# Patient Record
Sex: Female | Born: 1935
Health system: Southern US, Community
[De-identification: ages and names within clinical notes are randomized; demographics above are authoritative.]

## PROBLEM LIST (undated history)

## (undated) DIAGNOSIS — E785 Hyperlipidemia, unspecified: Secondary | ICD-10-CM

## (undated) DIAGNOSIS — K219 Gastro-esophageal reflux disease without esophagitis: Secondary | ICD-10-CM

## (undated) DIAGNOSIS — K56609 Unspecified intestinal obstruction, unspecified as to partial versus complete obstruction: Secondary | ICD-10-CM

## (undated) DIAGNOSIS — I639 Cerebral infarction, unspecified: Secondary | ICD-10-CM

## (undated) DIAGNOSIS — G459 Transient cerebral ischemic attack, unspecified: Secondary | ICD-10-CM

## (undated) DIAGNOSIS — M1990 Unspecified osteoarthritis, unspecified site: Secondary | ICD-10-CM

## (undated) DIAGNOSIS — Z860100 Personal history of colon polyps, unspecified: Secondary | ICD-10-CM

## (undated) DIAGNOSIS — I10 Essential (primary) hypertension: Secondary | ICD-10-CM

## (undated) DIAGNOSIS — I341 Nonrheumatic mitral (valve) prolapse: Secondary | ICD-10-CM

## (undated) DIAGNOSIS — K5790 Diverticulosis of intestine, part unspecified, without perforation or abscess without bleeding: Secondary | ICD-10-CM

## (undated) HISTORY — PX: APPENDECTOMY: SHX54

## (undated) HISTORY — DX: Hyperlipidemia, unspecified: E78.5

## (undated) HISTORY — DX: Cerebral infarction, unspecified: I63.9

## (undated) HISTORY — DX: Personal history of colon polyps, unspecified: Z86.0100

## (undated) HISTORY — DX: Unspecified intestinal obstruction, unspecified as to partial versus complete obstruction: K56.609

## (undated) HISTORY — DX: Personal history of colonic polyps: Z86.010

## (undated) HISTORY — PX: ROTATOR CUFF REPAIR: SHX139

## (undated) HISTORY — DX: Essential (primary) hypertension: I10

## (undated) HISTORY — DX: Transient cerebral ischemic attack, unspecified: G45.9

## (undated) HISTORY — DX: Unspecified osteoarthritis, unspecified site: M19.90

## (undated) HISTORY — DX: Gastro-esophageal reflux disease without esophagitis: K21.9

## (undated) HISTORY — DX: Diverticulosis of intestine, part unspecified, without perforation or abscess without bleeding: K57.90

## (undated) HISTORY — DX: Nonrheumatic mitral (valve) prolapse: I34.1

---

## 1997-04-13 HISTORY — PX: REPLACEMENT TOTAL KNEE: SUR1224

## 1998-04-13 HISTORY — PX: CATARACT EXTRACTION: SUR2

## 1998-09-04 ENCOUNTER — Encounter: Payer: Self-pay | Admitting: Orthopedic Surgery

## 1998-09-11 ENCOUNTER — Inpatient Hospital Stay (HOSPITAL_COMMUNITY): Admission: RE | Admit: 1998-09-11 | Discharge: 1998-09-16 | Payer: Self-pay | Admitting: Orthopedic Surgery

## 1998-09-11 ENCOUNTER — Encounter: Payer: Self-pay | Admitting: Orthopedic Surgery

## 1998-09-14 ENCOUNTER — Encounter: Payer: Self-pay | Admitting: Orthopedic Surgery

## 1998-09-16 ENCOUNTER — Encounter: Payer: Self-pay | Admitting: Orthopedic Surgery

## 2001-04-13 LAB — HM COLONOSCOPY

## 2001-08-30 ENCOUNTER — Encounter: Payer: Self-pay | Admitting: Internal Medicine

## 2002-07-20 ENCOUNTER — Encounter: Payer: Self-pay | Admitting: Internal Medicine

## 2003-06-30 ENCOUNTER — Emergency Department (HOSPITAL_COMMUNITY): Admission: EM | Admit: 2003-06-30 | Discharge: 2003-07-01 | Payer: Self-pay | Admitting: Emergency Medicine

## 2003-08-14 ENCOUNTER — Encounter: Payer: Self-pay | Admitting: Internal Medicine

## 2003-10-29 ENCOUNTER — Encounter: Payer: Self-pay | Admitting: Family Medicine

## 2003-11-19 ENCOUNTER — Encounter: Payer: Self-pay | Admitting: Internal Medicine

## 2004-05-12 ENCOUNTER — Ambulatory Visit: Payer: Self-pay | Admitting: Internal Medicine

## 2004-06-13 ENCOUNTER — Ambulatory Visit: Payer: Self-pay | Admitting: Internal Medicine

## 2004-08-06 ENCOUNTER — Ambulatory Visit: Payer: Self-pay | Admitting: Internal Medicine

## 2004-09-09 ENCOUNTER — Ambulatory Visit: Payer: Self-pay | Admitting: Internal Medicine

## 2004-11-11 DIAGNOSIS — I639 Cerebral infarction, unspecified: Secondary | ICD-10-CM

## 2004-11-11 HISTORY — DX: Cerebral infarction, unspecified: I63.9

## 2004-11-29 ENCOUNTER — Other Ambulatory Visit: Payer: Self-pay

## 2004-11-29 ENCOUNTER — Inpatient Hospital Stay: Payer: Self-pay | Admitting: Internal Medicine

## 2004-12-03 ENCOUNTER — Ambulatory Visit: Payer: Self-pay | Admitting: Internal Medicine

## 2004-12-08 ENCOUNTER — Ambulatory Visit: Payer: Self-pay | Admitting: *Deleted

## 2004-12-17 ENCOUNTER — Ambulatory Visit: Payer: Self-pay | Admitting: *Deleted

## 2005-01-05 ENCOUNTER — Ambulatory Visit: Payer: Self-pay | Admitting: Internal Medicine

## 2005-05-06 ENCOUNTER — Ambulatory Visit: Payer: Self-pay | Admitting: Internal Medicine

## 2005-07-29 ENCOUNTER — Ambulatory Visit: Payer: Self-pay | Admitting: Internal Medicine

## 2005-11-27 ENCOUNTER — Ambulatory Visit: Payer: Self-pay | Admitting: Internal Medicine

## 2005-11-30 ENCOUNTER — Ambulatory Visit: Payer: Self-pay | Admitting: Internal Medicine

## 2006-03-29 ENCOUNTER — Ambulatory Visit: Payer: Self-pay | Admitting: Internal Medicine

## 2006-07-21 ENCOUNTER — Ambulatory Visit: Payer: Self-pay | Admitting: Internal Medicine

## 2006-07-28 ENCOUNTER — Ambulatory Visit: Payer: Self-pay | Admitting: Internal Medicine

## 2006-11-10 DIAGNOSIS — I6350 Cerebral infarction due to unspecified occlusion or stenosis of unspecified cerebral artery: Secondary | ICD-10-CM | POA: Insufficient documentation

## 2006-11-10 DIAGNOSIS — E785 Hyperlipidemia, unspecified: Secondary | ICD-10-CM | POA: Insufficient documentation

## 2006-11-10 DIAGNOSIS — I10 Essential (primary) hypertension: Secondary | ICD-10-CM | POA: Insufficient documentation

## 2006-11-10 DIAGNOSIS — K648 Other hemorrhoids: Secondary | ICD-10-CM | POA: Insufficient documentation

## 2006-11-10 DIAGNOSIS — Z860100 Personal history of colon polyps, unspecified: Secondary | ICD-10-CM | POA: Insufficient documentation

## 2006-11-10 DIAGNOSIS — K219 Gastro-esophageal reflux disease without esophagitis: Secondary | ICD-10-CM

## 2006-11-23 ENCOUNTER — Ambulatory Visit: Payer: Self-pay | Admitting: Internal Medicine

## 2006-11-24 LAB — CONVERTED CEMR LAB
ALT: 18 units/L (ref 0–35)
Albumin: 3.9 g/dL (ref 3.5–5.2)
BUN: 18 mg/dL (ref 6–23)
CO2: 30 meq/L (ref 19–32)
Calcium: 9.2 mg/dL (ref 8.4–10.5)
Chloride: 107 meq/L (ref 96–112)
Cholesterol: 178 mg/dL (ref 0–200)
Creatinine, Ser: 0.9 mg/dL (ref 0.4–1.2)
GFR calc Af Amer: 77 mL/min
GFR calc non Af Amer: 63 mL/min
Glucose, Bld: 97 mg/dL (ref 70–99)
HDL: 57.9 mg/dL (ref >39.0)
LDL Cholesterol: 106 mg/dL — ABNORMAL HIGH (ref 0–99)
Phosphorus: 4 mg/dL (ref 2.3–4.6)
Potassium: 4.4 meq/L (ref 3.5–5.1)
Sodium: 142 meq/L (ref 135–145)
Total CHOL/HDL Ratio: 3.1
Triglycerides: 72 mg/dL (ref 0–149)
VLDL: 14 mg/dL (ref 0–40)

## 2007-03-28 ENCOUNTER — Ambulatory Visit: Payer: Self-pay | Admitting: Internal Medicine

## 2007-03-29 ENCOUNTER — Telehealth (INDEPENDENT_AMBULATORY_CARE_PROVIDER_SITE_OTHER): Payer: Self-pay | Admitting: *Deleted

## 2007-06-12 DIAGNOSIS — K56609 Unspecified intestinal obstruction, unspecified as to partial versus complete obstruction: Secondary | ICD-10-CM

## 2007-06-12 HISTORY — DX: Unspecified intestinal obstruction, unspecified as to partial versus complete obstruction: K56.609

## 2007-06-21 ENCOUNTER — Other Ambulatory Visit: Payer: Self-pay

## 2007-06-21 ENCOUNTER — Inpatient Hospital Stay: Payer: Medicare Other | Admitting: Surgery

## 2007-07-01 ENCOUNTER — Encounter: Payer: Self-pay | Admitting: Internal Medicine

## 2007-07-11 ENCOUNTER — Encounter: Payer: Self-pay | Admitting: Internal Medicine

## 2007-08-09 ENCOUNTER — Ambulatory Visit: Payer: Self-pay | Admitting: Internal Medicine

## 2007-08-10 LAB — CONVERTED CEMR LAB
ALT: 14 units/L (ref 0–35)
AST: 25 units/L (ref 0–37)
Albumin: 4.1 g/dL (ref 3.5–5.2)
Alkaline Phosphatase: 68 units/L (ref 39–117)
BUN: 21 mg/dL (ref 6–23)
Basophils Absolute: 0 10*3/uL (ref 0.0–0.1)
Basophils Relative: 0.3 % (ref 0.0–1.0)
Bilirubin, Direct: 0.1 mg/dL (ref 0.0–0.3)
CO2: 26 meq/L (ref 19–32)
Calcium: 9.2 mg/dL (ref 8.4–10.5)
Chloride: 110 meq/L (ref 96–112)
Cholesterol: 158 mg/dL (ref 0–200)
Creatinine, Ser: 0.9 mg/dL (ref 0.4–1.2)
Eosinophils Absolute: 0.3 10*3/uL (ref 0.0–0.7)
Eosinophils Relative: 4.6 % (ref 0.0–5.0)
GFR calc Af Amer: 76 mL/min
GFR calc non Af Amer: 63 mL/min
Glucose, Bld: 107 mg/dL — ABNORMAL HIGH (ref 70–99)
HCT: 40.8 % (ref 36.0–46.0)
HDL: 45.7 mg/dL (ref >39.0)
Hemoglobin: 13.8 g/dL (ref 12.0–15.0)
LDL Cholesterol: 98 mg/dL (ref 0–99)
Lymphocytes Relative: 24.9 % (ref 12.0–46.0)
MCHC: 33.8 g/dL (ref 30.0–36.0)
MCV: 87.2 fL (ref 78.0–100.0)
Monocytes Absolute: 0.5 10*3/uL (ref 0.1–1.0)
Monocytes Relative: 8.8 % (ref 3.0–12.0)
Neutro Abs: 3.7 10*3/uL (ref 1.4–7.7)
Neutrophils Relative %: 61.4 % (ref 43.0–77.0)
Phosphorus: 3.8 mg/dL (ref 2.3–4.6)
Platelets: 221 10*3/uL (ref 150–400)
Potassium: 3.8 meq/L (ref 3.5–5.1)
RBC: 4.68 M/uL (ref 3.87–5.11)
RDW: 13.6 % (ref 11.5–14.6)
Sodium: 142 meq/L (ref 135–145)
TSH: 0.91 microintl units/mL (ref 0.35–5.50)
Total Bilirubin: 0.7 mg/dL (ref 0.3–1.2)
Total CHOL/HDL Ratio: 3.5
Total Protein: 7.1 g/dL (ref 6.0–8.3)
Triglycerides: 71 mg/dL (ref 0–149)
VLDL: 14 mg/dL (ref 0–40)
WBC: 6 10*3/uL (ref 4.5–10.5)

## 2007-12-09 ENCOUNTER — Ambulatory Visit: Payer: Self-pay | Admitting: Internal Medicine

## 2008-01-17 ENCOUNTER — Ambulatory Visit: Payer: Self-pay | Admitting: Family Medicine

## 2008-01-17 DIAGNOSIS — L03119 Cellulitis of unspecified part of limb: Secondary | ICD-10-CM

## 2008-01-17 DIAGNOSIS — L02419 Cutaneous abscess of limb, unspecified: Secondary | ICD-10-CM

## 2008-01-19 ENCOUNTER — Ambulatory Visit: Payer: Self-pay | Admitting: Internal Medicine

## 2008-03-30 ENCOUNTER — Ambulatory Visit: Payer: Self-pay | Admitting: Internal Medicine

## 2008-03-30 DIAGNOSIS — M179 Osteoarthritis of knee, unspecified: Secondary | ICD-10-CM | POA: Insufficient documentation

## 2008-03-30 DIAGNOSIS — M1710 Unilateral primary osteoarthritis, unspecified knee: Secondary | ICD-10-CM

## 2008-04-02 LAB — CONVERTED CEMR LAB
ALT: 15 units/L (ref 0–35)
AST: 25 units/L (ref 0–37)
Albumin: 4.7 g/dL (ref 3.5–5.2)
Alkaline Phosphatase: 88 units/L (ref 39–117)
BUN: 16 mg/dL (ref 6–23)
Basophils Absolute: 0 10*3/uL (ref 0.0–0.1)
Basophils Relative: 0 % (ref 0–1)
CO2: 20 meq/L (ref 19–32)
Calcium: 9.6 mg/dL (ref 8.4–10.5)
Chloride: 105 meq/L (ref 96–112)
Cholesterol: 168 mg/dL (ref 0–200)
Creatinine, Ser: 0.89 mg/dL (ref 0.40–1.20)
Eosinophils Absolute: 0.2 10*3/uL (ref 0.0–0.7)
Eosinophils Relative: 3 % (ref 0–5)
Glucose, Bld: 98 mg/dL (ref 70–99)
HCT: 45 % (ref 36.0–46.0)
HDL: 60 mg/dL (ref >39)
Hemoglobin: 14.6 g/dL (ref 12.0–15.0)
LDL Cholesterol: 86 mg/dL (ref 0–99)
Lymphocytes Relative: 26 % (ref 12–46)
Lymphs Abs: 1.8 10*3/uL (ref 0.7–4.0)
MCHC: 32.4 g/dL (ref 30.0–36.0)
MCV: 90.4 fL (ref 78.0–100.0)
Monocytes Absolute: 0.6 10*3/uL (ref 0.1–1.0)
Monocytes Relative: 9 % (ref 3–12)
Neutro Abs: 4.3 10*3/uL (ref 1.7–7.7)
Neutrophils Relative %: 62 % (ref 43–77)
Platelets: 253 10*3/uL (ref 150–400)
Potassium: 4.1 meq/L (ref 3.5–5.3)
RBC: 4.98 M/uL (ref 3.87–5.11)
RDW: 13.6 % (ref 11.5–15.5)
Sodium: 141 meq/L (ref 135–145)
TSH: 2.854 microintl units/mL (ref 0.350–4.50)
Total Bilirubin: 0.5 mg/dL (ref 0.3–1.2)
Total CHOL/HDL Ratio: 2.8
Total Protein: 7.6 g/dL (ref 6.0–8.3)
Triglycerides: 111 mg/dL (ref <150)
VLDL: 22 mg/dL (ref 0–40)
WBC: 6.9 10*3/uL (ref 4.0–10.5)

## 2008-04-04 ENCOUNTER — Telehealth: Payer: Self-pay | Admitting: Internal Medicine

## 2008-06-22 ENCOUNTER — Telehealth (INDEPENDENT_AMBULATORY_CARE_PROVIDER_SITE_OTHER): Payer: Self-pay | Admitting: *Deleted

## 2008-06-26 ENCOUNTER — Ambulatory Visit: Payer: Self-pay | Admitting: Family Medicine

## 2008-06-26 DIAGNOSIS — K5900 Constipation, unspecified: Secondary | ICD-10-CM | POA: Insufficient documentation

## 2008-07-27 ENCOUNTER — Ambulatory Visit: Payer: Self-pay | Admitting: Internal Medicine

## 2008-11-23 ENCOUNTER — Ambulatory Visit: Payer: Self-pay | Admitting: Internal Medicine

## 2008-11-23 LAB — CONVERTED CEMR LAB
ALT: 16 units/L (ref 0–35)
AST: 29 units/L (ref 0–37)
Albumin: 4.1 g/dL (ref 3.5–5.2)
Alkaline Phosphatase: 76 units/L (ref 39–117)
BUN: 26 mg/dL — ABNORMAL HIGH (ref 6–23)
Basophils Absolute: 0 10*3/uL (ref 0.0–0.1)
Basophils Relative: 0.1 % (ref 0.0–3.0)
Bilirubin, Direct: 0 mg/dL (ref 0.0–0.3)
CO2: 25 meq/L (ref 19–32)
Calcium: 9.2 mg/dL (ref 8.4–10.5)
Chloride: 109 meq/L (ref 96–112)
Cholesterol: 143 mg/dL (ref 0–200)
Creatinine, Ser: 0.9 mg/dL (ref 0.4–1.2)
Eosinophils Absolute: 0.2 10*3/uL (ref 0.0–0.7)
Eosinophils Relative: 2.8 % (ref 0.0–5.0)
Glucose, Bld: 102 mg/dL — ABNORMAL HIGH (ref 70–99)
HCT: 43.2 % (ref 36.0–46.0)
HDL: 51.9 mg/dL (ref >39.00)
Hemoglobin: 14.5 g/dL (ref 12.0–15.0)
LDL Cholesterol: 82 mg/dL (ref 0–99)
Lymphocytes Relative: 24.2 % (ref 12.0–46.0)
Lymphs Abs: 1.5 10*3/uL (ref 0.7–4.0)
MCHC: 33.5 g/dL (ref 30.0–36.0)
MCV: 90 fL (ref 78.0–100.0)
Monocytes Absolute: 0.6 10*3/uL (ref 0.1–1.0)
Monocytes Relative: 9.1 % (ref 3.0–12.0)
Neutro Abs: 3.9 10*3/uL (ref 1.4–7.7)
Neutrophils Relative %: 63.8 % (ref 43.0–77.0)
Phosphorus: 3.8 mg/dL (ref 2.3–4.6)
Platelets: 184 10*3/uL (ref 150.0–400.0)
Potassium: 4.1 meq/L (ref 3.5–5.1)
RBC: 4.8 M/uL (ref 3.87–5.11)
RDW: 13.1 % (ref 11.5–14.6)
Sodium: 141 meq/L (ref 135–145)
TSH: 1.21 microintl units/mL (ref 0.35–5.50)
Total Bilirubin: 0.8 mg/dL (ref 0.3–1.2)
Total CHOL/HDL Ratio: 3
Total Protein: 7.2 g/dL (ref 6.0–8.3)
Triglycerides: 45 mg/dL (ref 0.0–149.0)
VLDL: 9 mg/dL (ref 0.0–40.0)
WBC: 6.2 10*3/uL (ref 4.5–10.5)

## 2009-01-03 ENCOUNTER — Encounter: Payer: Self-pay | Admitting: Internal Medicine

## 2009-01-03 ENCOUNTER — Telehealth: Payer: Self-pay | Admitting: Internal Medicine

## 2009-01-11 ENCOUNTER — Ambulatory Visit: Payer: Self-pay | Admitting: Internal Medicine

## 2009-01-15 ENCOUNTER — Encounter: Payer: Self-pay | Admitting: Internal Medicine

## 2009-01-17 ENCOUNTER — Encounter: Payer: Self-pay | Admitting: Internal Medicine

## 2009-05-22 ENCOUNTER — Ambulatory Visit: Payer: Self-pay | Admitting: Family Medicine

## 2009-06-13 ENCOUNTER — Ambulatory Visit: Payer: Self-pay | Admitting: Internal Medicine

## 2009-08-14 ENCOUNTER — Ambulatory Visit: Payer: Self-pay | Admitting: Internal Medicine

## 2009-08-14 DIAGNOSIS — J069 Acute upper respiratory infection, unspecified: Secondary | ICD-10-CM | POA: Insufficient documentation

## 2009-12-17 ENCOUNTER — Ambulatory Visit: Payer: Self-pay | Admitting: Internal Medicine

## 2009-12-17 DIAGNOSIS — I69354 Hemiplegia and hemiparesis following cerebral infarction affecting left non-dominant side: Secondary | ICD-10-CM | POA: Insufficient documentation

## 2009-12-19 LAB — CONVERTED CEMR LAB
ALT: 14 units/L (ref 0–35)
AST: 26 units/L (ref 0–37)
Albumin: 4.4 g/dL (ref 3.5–5.2)
Alkaline Phosphatase: 76 units/L (ref 39–117)
BUN: 21 mg/dL (ref 6–23)
Basophils Absolute: 0 10*3/uL (ref 0.0–0.1)
Basophils Relative: 0.5 % (ref 0.0–3.0)
Bilirubin, Direct: 0.1 mg/dL (ref 0.0–0.3)
CO2: 27 meq/L (ref 19–32)
Calcium: 9 mg/dL (ref 8.4–10.5)
Chloride: 107 meq/L (ref 96–112)
Cholesterol: 155 mg/dL (ref 0–200)
Creatinine, Ser: 0.9 mg/dL (ref 0.4–1.2)
Eosinophils Absolute: 0.3 10*3/uL (ref 0.0–0.7)
Eosinophils Relative: 3.5 % (ref 0.0–5.0)
GFR calc non Af Amer: 63.56 mL/min (ref >60)
Glucose, Bld: 84 mg/dL (ref 70–99)
HCT: 43 % (ref 36.0–46.0)
HDL: 54.9 mg/dL (ref >39.00)
Hemoglobin: 14.7 g/dL (ref 12.0–15.0)
LDL Cholesterol: 77 mg/dL (ref 0–99)
Lymphocytes Relative: 21.7 % (ref 12.0–46.0)
Lymphs Abs: 1.8 10*3/uL (ref 0.7–4.0)
MCHC: 34.2 g/dL (ref 30.0–36.0)
MCV: 90.6 fL (ref 78.0–100.0)
Monocytes Absolute: 0.9 10*3/uL (ref 0.1–1.0)
Monocytes Relative: 10.1 % (ref 3.0–12.0)
Neutro Abs: 5.4 10*3/uL (ref 1.4–7.7)
Neutrophils Relative %: 64.2 % (ref 43.0–77.0)
Phosphorus: 3.6 mg/dL (ref 2.3–4.6)
Platelets: 217 10*3/uL (ref 150.0–400.0)
Potassium: 4.3 meq/L (ref 3.5–5.1)
RBC: 4.74 M/uL (ref 3.87–5.11)
RDW: 14.6 % (ref 11.5–14.6)
Sodium: 143 meq/L (ref 135–145)
TSH: 1.36 microintl units/mL (ref 0.35–5.50)
Total Bilirubin: 0.7 mg/dL (ref 0.3–1.2)
Total CHOL/HDL Ratio: 3
Total Protein: 7.1 g/dL (ref 6.0–8.3)
Triglycerides: 116 mg/dL (ref 0.0–149.0)
VLDL: 23.2 mg/dL (ref 0.0–40.0)
WBC: 8.5 10*3/uL (ref 4.5–10.5)

## 2010-05-13 NOTE — Assessment & Plan Note (Signed)
Summary: 6 MO. F/U/BIR   Vital Signs:  Patient profile:   77 year old female Weight:      148 pounds Temp:     98.3 degrees F oral Pulse rate:   60 / minute Pulse rhythm:   regular BP sitting:   126 / 80  (left arm) Cuff size:   regular  Vitals Entered By: Mervin Hack CMA Duncan Dull) (June 13, 2009 4:14 PM) CC: 6 month follow-up   History of Present Illness: Saw Dr August Saucer Did have fracture has follow up tomorrow--hopefully will be coming off tough since she is right handed Can't drive either--son has had to drive her and sign for her  Still happy at [facility] Everyone has helped her there--she feels supported  No new neurological symptoms Balance is okay no falls  No chest pain No breathing problems No change in exercise tolerance No edema  No trouble with meds No stomach troubles  No myalgias  Allergies: 1)  * Bextra 2)  * Pencillin 3)  * Emycin 4)  * Keflex 5)  * Sulfa 6)  * Vicodin  Past History:  Past medical, surgical, family and social histories (including risk factors) reviewed for relevance to current acute and chronic problems.  Past Medical History: Reviewed history from 03/30/2008 and no changes required. Colonic polyps, hx of Diverticulosis, colon GERD Hyperlipidemia Hypertension CVA  8/06 TIA  hospitalized 12/02 Mitral valve prolapse Osteoarthritis  Past Surgical History: Reviewed history from 07/05/2007 and no changes required. Appendectomy 1940 Right knee replacement  ~1999 Right rotator cuff repair Bilateral Cataracts with IOL  ~2000 Echo- negative 11/04 CVA 08/06 MRI brain 12/02 Colonoscopy/EGD 2003 3/09 Laparatomy and enterolysis for bowel obstruction (Dr Renda Rolls)  Family History: Reviewed history from 03/28/2007 and no changes required. Father: Died at age 72, cerebral hemm Mother: Died at age 63, Alzheimer's Siblings: 2 brothers, 2 sisters CAD in  brother CABG x 5 No HTN DM  brother, sister and Mom No breast or  colon cancer Depression in  brother  Social History: Reviewed history from 11/10/2006 and no changes required. Widowed Children: 2 out of home Retired--housewife Former Smoker Alcohol use-no  Review of Systems       appetite is good weight up 3# over 6 months---now winter though sleeps okay still very happy at [employer]  Physical Exam  General:  alert and normal appearance.   Neck:  supple, no masses, no thyromegaly, no carotid bruits, and no cervical lymphadenopathy.   Lungs:  normal respiratory effort and normal breath sounds.   Heart:  normal rate, regular rhythm, no murmur, and no gallop.   Msk:  cast on right wrist no active synovitis Extremities:  no edema Psych:  normally interactive, good eye contact, not anxious appearing, and not depressed appearing.     Impression & Recommendations:  Problem # 1:  CVA (ICD-434.91) Assessment Unchanged stable status BP and chol Rx plus aspirin only slight balance issues now  Her updated medication list for this problem includes:    Aspirin 81 Mg Tbec (Aspirin) .Marland Kitchen... Take one by mouth every other day  Problem # 2:  HYPERTENSION (ICD-401.9) Assessment: Unchanged good control will check labs next time  Her updated medication list for this problem includes:    Norvasc 10 Mg Tabs (Amlodipine besylate) .Marland Kitchen... Take one by mouth once a day  BP today: 126/80 Prior BP: 144/84 (05/22/2009)  Labs Reviewed: K+: 4.1 (11/23/2008) Creat: : 0.9 (11/23/2008)   Chol: 143 (11/23/2008)   HDL:  51.90 (11/23/2008)   LDL: 82 (11/23/2008)   TG: 45.0 (11/23/2008)  Problem # 3:  HYPERLIPIDEMIA (ICD-272.4) Assessment: Unchanged no problems with meds at goal labs next time  Her updated medication list for this problem includes:    Pravastatin Sodium 40 Mg Tabs (Pravastatin sodium) .Marland Kitchen... 1 tablet by mouth at bedtime  Labs Reviewed: SGOT: 29 (11/23/2008)   SGPT: 16 (11/23/2008)   HDL:51.90 (11/23/2008), 60 (03/30/2008)  LDL:82  (11/23/2008), 86 (57/84/6962)  Chol:143 (11/23/2008), 168 (03/30/2008)  Trig:45.0 (11/23/2008), 111 (03/30/2008)  Problem # 4:  OSTEOARTHRITIS (ICD-715.90) Assessment: Unchanged mild issues only  Complete Medication List: 1)  Pravastatin Sodium 40 Mg Tabs (Pravastatin sodium) .Marland Kitchen.. 1 tablet by mouth at bedtime 2)  Norvasc 10 Mg Tabs (Amlodipine besylate) .... Take one by mouth once a day 3)  Aspirin 81 Mg Tbec (Aspirin) .... Take one by mouth every other day  Patient Instructions: 1)  Please schedule a follow-up appointment in 6 months .   Current Allergies (reviewed today): * BEXTRA * PENCILLIN * EMYCIN * KEFLEX * SULFA * VICODIN

## 2010-05-13 NOTE — Assessment & Plan Note (Signed)
Summary: CONGESTION/ 2:15   Vital Signs:  Patient profile:   77 year old female Height:      61 inches Weight:      145 pounds BMI:     27.50 Temp:     98.3 degrees F oral Pulse rate:   92 / minute Pulse rhythm:   regular BP sitting:   132 / 84  (left arm) Cuff size:   regular  Vitals Entered By: Linde Gillis CMA Duncan Dull) (Aug 14, 2009 2:10 PM) CC: congestion   History of Present Illness: Sick since 4 days ago or so Copious rhinorrhea--clear again now Chest feels congested and tight Cough which is dry again--some yellow stuff yesterday Fever yesterday--99.2 No SOB No ear pain Mild sore throat  Son urged her to come in   Allergies: 1)  * Bextra 2)  * Pencillin 3)  * Emycin 4)  * Keflex 5)  * Sulfa 6)  * Vicodin  Past History:  Past medical, surgical, family and social histories (including risk factors) reviewed for relevance to current acute and chronic problems.  Past Medical History: Reviewed history from 03/30/2008 and no changes required. Colonic polyps, hx of Diverticulosis, colon GERD Hyperlipidemia Hypertension CVA  8/06 TIA  hospitalized 12/02 Mitral valve prolapse Osteoarthritis  Past Surgical History: Reviewed history from 07/05/2007 and no changes required. Appendectomy 1940 Right knee replacement  ~1999 Right rotator cuff repair Bilateral Cataracts with IOL  ~2000 Echo- negative 11/04 CVA 08/06 MRI brain 12/02 Colonoscopy/EGD 2003 3/09 Laparatomy and enterolysis for bowel obstruction (Dr Renda Rolls)  Family History: Reviewed history from 03/28/2007 and no changes required. Father: Died at age 10, cerebral hemm Mother: Died at age 74, Alzheimer's Siblings: 2 brothers, 2 sisters CAD in  brother CABG x 5 No HTN DM  brother, sister and Mom No breast or colon cancer Depression in  brother  Social History: Reviewed history from 11/10/2006 and no changes required. Widowed Children: 2 out of home Retired--housewife Former  Smoker Alcohol use-no  Review of Systems       appetite is fair--though decreased today No vomiting or diarrhea  Physical Exam  General:  alert.  NAD Head:  no sinus tenderness Ears:  R ear normal and L ear normal.   Nose:  mild inflammation bilat Mouth:  slightl pharyngeal injection without exudate Neck:  supple, no masses, and no cervical lymphadenopathy.   Lungs:  normal respiratory effort, no intercostal retractions, no accessory muscle use, and normal breath sounds.     Impression & Recommendations:  Problem # 1:  URI (ICD-465.9) Assessment New still seems like it is just a virus discussed supportive care  if worsens, will use Rx for doxy  Complete Medication List: 1)  Pravastatin Sodium 40 Mg Tabs (Pravastatin sodium) .Marland Kitchen.. 1 tablet by mouth at bedtime 2)  Norvasc 10 Mg Tabs (Amlodipine besylate) .... Take one by mouth once a day 3)  Aspirin 81 Mg Tbec (Aspirin) .... Take one by mouth every other day 4)  Doxycycline Hyclate 100 Mg Caps (Doxycycline hyclate) .Marland Kitchen.. 1 by mouth two times a day for respiratory infection  Patient Instructions: 1)  Please schedule a follow-up appointment as needed  or for regular check up 2)  Please take the antibiotic if cough gets worse with lots of nasty mucus Prescriptions: DOXYCYCLINE HYCLATE 100 MG CAPS (DOXYCYCLINE HYCLATE) 1 by mouth two times a day for respiratory infection  #20 x 0   Entered and Authorized by:   Cindee Salt MD  Signed by:   Cindee Salt MD on 08/14/2009   Method used:   Print then Give to Patient   RxID:   [id number]   Current Allergies (reviewed today): * BEXTRA * PENCILLIN * EMYCIN * KEFLEX * SULFA * VICODIN

## 2010-05-13 NOTE — Assessment & Plan Note (Signed)
Summary: ROA FOR 6 MONTH FOLLOW-UP/JRR   Vital Signs:  Patient profile:   77 year old female Weight:      145 pounds Temp:     98.6 degrees F oral Pulse rate:   76 / minute Pulse rhythm:   regular BP sitting:   152 / 70  (left arm) Cuff size:   regular  Vitals Entered By: Mervin Hack CMA Duncan Dull) (December 17, 2009 10:43 AM) CC: 6 month follow-up   History of Present Illness: doing fairly well Ready for a tour up to [place] well Still enjoys [place] Has made some wonderful friends Occ gets bored---does puzzles Not as much into reading as much--some trouble concentrating since husband's death in 08/31/2000 Enjoys the social interaction  No weakness no trouble swallowing or speaking Mild troubles with memory but no progression trouble with word finding  No chest pain no SOB No edema  Allergies: 1)  * Bextra 2)  * Pencillin 3)  * Emycin 4)  * Keflex 5)  * Sulfa 6)  * Vicodin  Past History:  Past medical, surgical, family and social histories (including risk factors) reviewed for relevance to current acute and chronic problems.  Past Medical History: Reviewed history from 03/30/2008 and no changes required. Colonic polyps, hx of Diverticulosis, colon GERD Hyperlipidemia Hypertension CVA  8/06 TIA  hospitalized 12/02 Mitral valve prolapse Osteoarthritis  Past Surgical History: Reviewed history from 07/05/2007 and no changes required. Appendectomy 1940 Right knee replacement  1997-08-31 Right rotator cuff repair Bilateral Cataracts with IOL  ~2000 Echo- negative 11/04 CVA 08/06 MRI brain 12/02 Colonoscopy/EGD 08-31-01 3/09 Laparatomy and enterolysis for bowel obstruction (Dr Renda Rolls)  Family History: Reviewed history from 03/28/2007 and no changes required. Father: Died at age 34, cerebral hemm Mother: Died at age 42, Alzheimer's Siblings: 2 brothers, 2 sisters CAD in  brother CABG x 5 No HTN DM  brother, sister and  Mom No breast or colon cancer Depression in  brother  Social History: Reviewed history from 11/10/2006 and no changes required. Widowed Children: 2 out of home Retired--housewife Former Smoker Alcohol use-no  Review of Systems       weight stable sleeps fairly well but is up early in the morning--gets up by 4-5AM No naps  Physical Exam  General:  alert and normal appearance.   Neck:  supple, no masses, no thyromegaly, no carotid bruits, and no cervical lymphadenopathy.   Lungs:  normal respiratory effort, no intercostal retractions, no accessory muscle use, and normal breath sounds.   Heart:  normal rate, regular rhythm, no murmur, and no gallop.   Abdomen:  soft and non-tender.   Extremities:  no edema Neurologic:  alert & oriented X3 and strength normal in all extremities.   Psych:  normally interactive, good eye contact, not anxious appearing, and not depressed appearing.     Impression & Recommendations:  Problem # 1:  OTHER LATE EFFECTS OF CEREBROVASCULAR DISEASE (ICD-438.89) Assessment Unchanged mild recall issues otherwise doing well  Her updated medication list for this problem includes:    Aspirin 81 Mg Tbec (Aspirin) .Marland Kitchen... Take one by mouth every other day  Problem # 2:  HYPERTENSION (ICD-401.9) Assessment: Unchanged  reasonable control no changes needed  Her updated medication list for this problem includes:    Norvasc 10 Mg Tabs (Amlodipine besylate) .Marland Kitchen... Take one by mouth once a day  BP today: 152/70 Prior BP: 132/84 (08/14/2009)  Labs Reviewed: K+: 4.1 (11/23/2008) Creat: : 0.9 (  11/23/2008)   Chol: 143 (11/23/2008)   HDL: 51.90 (11/23/2008)   LDL: 82 (11/23/2008)   TG: 45.0 (11/23/2008)  Orders: TLB-Renal Function Panel (80069-RENAL) TLB-CBC Platelet - w/Differential (85025-CBCD) TLB-TSH (Thyroid Stimulating Hormone) (84443-TSH) Venipuncture (14782)  Problem # 3:  HYPERLIPIDEMIA (ICD-272.4) Assessment: Unchanged  due for labs no  myalgias  Her updated medication list for this problem includes:    Pravastatin Sodium 40 Mg Tabs (Pravastatin sodium) .Marland Kitchen... 1 tablet by mouth at bedtime  Labs Reviewed: SGOT: 29 (11/23/2008)   SGPT: 16 (11/23/2008)   HDL:51.90 (11/23/2008), 60 (03/30/2008)  LDL:82 (11/23/2008), 86 (03/30/2008)  Chol:143 (11/23/2008), 168 (03/30/2008)  Trig:45.0 (11/23/2008), 111 (03/30/2008)  Orders: TLB-Lipid Panel (80061-LIPID) TLB-Hepatic/Liver Function Pnl (80076-HEPATIC)  Problem # 4:  OSTEOARTHRITIS (ICD-715.90) Assessment: Unchanged occ mild symptoms  Complete Medication List: 1)  Pravastatin Sodium 40 Mg Tabs (Pravastatin sodium) .Marland Kitchen.. 1 tablet by mouth at bedtime 2)  Norvasc 10 Mg Tabs (Amlodipine besylate) .... Take one by mouth once a day 3)  Aspirin 81 Mg Tbec (Aspirin) .... Take one by mouth every other day  Other Orders: Flu Vaccine 50yrs + [phone]) Admin 1st Vaccine (30865) Admin 1st Vaccine [facility]) [phone])  Patient Instructions: 1)  Please schedule a follow-up appointment in 6 months .   Current Allergies (reviewed today): * BEXTRA * PENCILLIN * EMYCIN * KEFLEX * SULFA * VICODIN   Influenza Vaccine    Vaccine Type: Fluvax 3+    Site: left deltoid    Mfr: GlaxoSmithKline    Dose: 0.5 ml    Route: IM    Given by: Mervin Hack CMA (AAMA)    Exp. Date: 10/11/2010    Lot #: [id number]    VIS given: 11/05/09 version given December 17, 2009.  Flu Vaccine Consent Questions    Do you have a history of severe allergic reactions to this vaccine? no    Any prior history of allergic reactions to egg and/or gelatin? no    Do you have a sensitivity to the preservative Thimersol? no    Do you have a past history of Guillan-Barre Syndrome? no    Do you currently have an acute febrile illness? no    Have you ever had a severe reaction to latex? no    Vaccine information given and explained to patient? yes    Are you currently pregnant? no

## 2010-05-13 NOTE — Assessment & Plan Note (Signed)
Summary: 2:15 HURT HAND/DS   Vital Signs:  Patient profile:   77 year old female Height:      61 inches Weight:      147 pounds Temp:     98.1 degrees F oral Pulse rate:   88 / minute Pulse rhythm:   regular BP sitting:   144 / 84  (left arm) Cuff size:   regular  Vitals Entered By: Delilah Shan CMA Duncan Dull) (May 22, 2009 2:13 PM) CC: Hands hurt   History of Present Illness: 77 yo female here for right wrist injury.  She was pulling open the door of the dryer this morning, lost her balance, and fell backwards.  Landed with her right hand twisted back wards, immediately felt something give way. Immediate swelling, echymosis and pain. Has been applying ice and taking Tylenol/Ibuprofen for pain.    Right hand started to swell a few hours later.  Current Medications (verified): 1)  Pravastatin Sodium 40 Mg Tabs (Pravastatin Sodium) .Marland Kitchen.. 1 Tablet By Mouth At Bedtime 2)  Norvasc 10 Mg  Tabs (Amlodipine Besylate) .... Take One By Mouth Once A Day 3)  Aspirin 81 Mg  Tbec (Aspirin) .... Take One By Mouth Every Other Day  Allergies: 1)  * Bextra 2)  * Pencillin 3)  * Emycin 4)  * Keflex 5)  * Sulfa 6)  * Vicodin  Review of Systems      See HPI MS:  Complains of joint pain, joint redness, and joint swelling; denies loss of strength.  Physical Exam  General:  alert.  NAD Msk:  Right wrist- swollen with echymosis, decreased ROM- pain with flexion and extension- very tender to palp over entire wrist- two palpable prominences over medial wrist- soft tissue vs bony fragment.  Right hand- swollen, no tenderness to palp, FROM, normal pulses.   Impression & Recommendations:  Problem # 1:  WRIST PAIN, RIGHT (ICD-719.43) Assessment New Will refer immediately to ortho for better imaging and work up as most likely fractured or at least a major sprain. Pt in agreement with plan. Orders: Orthopedic Referral (Ortho)  Complete Medication List: 1)  Pravastatin Sodium 40 Mg Tabs  (Pravastatin sodium) .Marland Kitchen.. 1 tablet by mouth at bedtime 2)  Norvasc 10 Mg Tabs (Amlodipine besylate) .... Take one by mouth once a day 3)  Aspirin 81 Mg Tbec (Aspirin) .... Take one by mouth every other day  Patient Instructions: 1)  Please stop by to see Shirlee Limerick on your way out.  Current Allergies (reviewed today): * BEXTRA * PENCILLIN * EMYCIN * KEFLEX * SULFA * VICODIN

## 2010-06-23 ENCOUNTER — Ambulatory Visit: Payer: Self-pay | Admitting: Internal Medicine

## 2010-06-24 ENCOUNTER — Ambulatory Visit: Payer: Self-pay | Admitting: Internal Medicine

## 2010-06-25 ENCOUNTER — Encounter: Payer: Self-pay | Admitting: Internal Medicine

## 2010-07-02 ENCOUNTER — Encounter: Payer: Self-pay | Admitting: Internal Medicine

## 2010-07-02 ENCOUNTER — Ambulatory Visit (INDEPENDENT_AMBULATORY_CARE_PROVIDER_SITE_OTHER): Payer: Medicare Other | Admitting: Internal Medicine

## 2010-07-02 VITALS — BP 150/70 | HR 90 | Temp 98.4°F | Ht 61.0 in | Wt 148.0 lb

## 2010-07-02 DIAGNOSIS — I10 Essential (primary) hypertension: Secondary | ICD-10-CM

## 2010-07-02 DIAGNOSIS — I6350 Cerebral infarction due to unspecified occlusion or stenosis of unspecified cerebral artery: Secondary | ICD-10-CM

## 2010-07-02 DIAGNOSIS — I69998 Other sequelae following unspecified cerebrovascular disease: Secondary | ICD-10-CM

## 2010-07-02 DIAGNOSIS — M1990 Unspecified osteoarthritis, unspecified site: Secondary | ICD-10-CM

## 2010-07-02 DIAGNOSIS — E785 Hyperlipidemia, unspecified: Secondary | ICD-10-CM

## 2010-07-02 DIAGNOSIS — L570 Actinic keratosis: Secondary | ICD-10-CM

## 2010-07-02 NOTE — Patient Instructions (Signed)
Please call if the red area on your nose doesn't heal up---you will need to see a dermatologist

## 2010-07-02 NOTE — Progress Notes (Signed)
Subjective:    Patient ID: Tracy Novak, female    DOB: [birth year], 77 y.o.   MRN: [id number]  Hypertension This is a chronic problem. The current episode started more than 1 year ago. The problem is unchanged. The problem is controlled. Associated symptoms include peripheral edema. Pertinent negatives include no blurred vision, chest pain or shortness of breath. (Slight edema if she has extended time sitting) There are no associated agents to hypertension. Past treatments include calcium channel blockers. The current treatment provides significant improvement. There are no compliance problems.   Hyperlipidemia This is a chronic problem. The current episode started more than 1 year ago. The problem is controlled. Factors aggravating her hyperlipidemia include no known factors. Pertinent negatives include no chest pain or shortness of breath. Current antihyperlipidemic treatment includes statins. The current treatment provides significant improvement of lipids. There are no compliance problems.    Doing well Still loves [facility] at the [facility]  Ongoing problems with right shoulder At most minimal right sided changes from stroke  Ongoing left knee pain Doesn't want to consider surgery Occ  Uses ibuprofen  Past Medical History  Diagnosis Date  . History of colon polyps   . Diverticulosis   . GERD (gastroesophageal reflux disease)   . Hyperlipidemia   . Hypertension   . CVA (cerebral vascular accident) 11/2004  . TIA (transient ischemic attack)     hospitalized 12/02, brain MRI 12/02  . MVP (mitral valve prolapse)     Echo- negative 11/04  . Osteoarthritis   . Obstruction of bowel 3/09    Laparatomy and enterolysis- Dr. Renda Rolls    Past Surgical History  Procedure Date  . Appendectomy   . Replacement total knee 1999    right  . Rotator cuff repair     right  . Cataract extraction 2000    with IOL    Family History  Problem Relation Age of Onset  . Alzheimer's disease Mother     . Diabetes Mother   . Diabetes Sister   . Coronary artery disease Brother     CABG x 5  . Hypertension Neg Hx   . Cancer Neg Hx     no breast or colon cancer  . Diabetes Brother     History   Social History  . Marital Status: Widowed    Spouse Name: N/A    Number of Children: 2  . Years of Education: N/A   Occupational History  . housewife    Social History Main Topics  . Smoking status: Former Games developer  . Smokeless tobacco: Not on file  . Alcohol Use: Not on file  . Drug Use: No  . Sexually Active: Not on file   Other Topics Concern  . Not on file   Social History Narrative  . No narrative on file     Review of Systems  Eyes: Negative for blurred vision.  Respiratory: Negative for shortness of breath.   Cardiovascular: Negative for chest pain.  Has non healing lesion on left side of nose---goes back some weeks Sleeps well Weight is stable or up a few pounds     Objective:   Physical Exam  Constitutional: She appears well-developed and well-nourished. No distress.  Neck: Normal range of motion. Neck supple. No thyromegaly present.       No carotid bruits  Cardiovascular: Normal rate, regular rhythm and normal heart sounds.  Exam reveals no gallop.   No murmur heard. Pulmonary/Chest: Effort normal and breath sounds  normal. No respiratory distress. She has no wheezes. She has no rales.  Abdominal: Soft. She exhibits no mass. There is no tenderness.  Musculoskeletal: She exhibits no edema and no tenderness.       Thickening in knees  Lymphadenopathy:    She has no cervical adenopathy.  Skin: No rash noted. No erythema.       Actinic on left side of nose  Psychiatric: She has a normal mood and affect. Judgment and thought content normal.          Assessment & Plan:

## 2010-09-09 ENCOUNTER — Telehealth: Payer: Self-pay | Admitting: *Deleted

## 2010-09-09 MED ORDER — PROMETHAZINE HCL 12.5 MG PO TABS: ORAL_TABLET | ORAL | Status: DC

## 2010-09-09 NOTE — Telephone Encounter (Signed)
Okay to send Rx for phenergan 12.5 1 tid prn for nausea #4 x 0 Needs to be seen if not better in 1-2 days

## 2010-09-09 NOTE — Telephone Encounter (Signed)
Phenergan called to [facility], advised pt's son.

## 2010-09-09 NOTE — Telephone Encounter (Signed)
Pt has had nausea since Friday.  No diarrhea, fever or abd pain.  She tries not to vomit, is unable to eat anything.  Asking if phenergan tablets can [facility].  Please let her know.

## 2010-09-30 ENCOUNTER — Ambulatory Visit (INDEPENDENT_AMBULATORY_CARE_PROVIDER_SITE_OTHER)
Admission: RE | Admit: 2010-09-30 | Discharge: 2010-09-30 | Disposition: A | Discharge status: Home / Self Care | Payer: Medicare Other | Source: Ambulatory Visit | Attending: Internal Medicine | Admitting: Internal Medicine

## 2010-09-30 ENCOUNTER — Ambulatory Visit: Admission: RE | Admit: 2010-09-30 | Payer: Medicare Other | Source: Ambulatory Visit

## 2010-09-30 ENCOUNTER — Ambulatory Visit (INDEPENDENT_AMBULATORY_CARE_PROVIDER_SITE_OTHER): Payer: Medicare Other | Admitting: Internal Medicine

## 2010-09-30 ENCOUNTER — Encounter: Payer: Self-pay | Admitting: Internal Medicine

## 2010-09-30 VITALS — BP 138/70 | HR 74 | Temp 98.5°F | Ht 61.0 in | Wt 145.0 lb

## 2010-09-30 DIAGNOSIS — R109 Unspecified abdominal pain: Secondary | ICD-10-CM

## 2010-09-30 DIAGNOSIS — R1031 Right lower quadrant pain: Secondary | ICD-10-CM | POA: Insufficient documentation

## 2010-09-30 NOTE — Assessment & Plan Note (Signed)
Has been going on for a while with nausea Now with cramping X-rays do not look like an obstructive pattern---will await radiologist report Will try to work on bowel regimen--she needs pills Does occ have prunes but not regularly eval by surgeon if pain persists

## 2010-09-30 NOTE — Patient Instructions (Signed)
Please try sennekot-S 2 tabs twice a day. Drink a glass of prune juice daily as well. If not clearing bowels within the next couple of days, try a fleet's enema or dulcolax suppository Call for surgeon referral if cramping not better by next week

## 2010-09-30 NOTE — Progress Notes (Signed)
Subjective:    Patient ID: Tracy Novak, female    DOB: Jan 22, 1920, 77 y.o.   MRN: [id number]  HPI Here with son Having a lot of nausea--then started with cramps recently No vomiting--can avoid this by swallowing a lot Nausea for about 3 weeks --- cramps started 2-3 days ago. Goes all the way across mid abdomen BM this AM---smaller than usual  Appetite is down ---afraid to eat for a while. Ate better yesterday Cramps are intermittent --not clearly related to eating or bowels No fever No chills but has noted some sweats at night  Phenergan didn't really help No other meds for nausea  Current Outpatient Prescriptions on File Prior to Visit  Medication Sig Dispense Refill  . amLODipine (NORVASC) 10 MG tablet Take 10 mg by mouth daily.        Marland Kitchen aspirin 81 MG tablet Take one by mouth every other day       . pravastatin (PRAVACHOL) 40 MG tablet Take 40 mg by mouth at bedtime.        Marland Kitchen DISCONTD: promethazine (PHENERGAN) 12.5 MG tablet Take one tablet by mouth three times a day as needed for nausea  4 tablet  0   Past Medical History  Diagnosis Date  . History of colon polyps   . Diverticulosis   . GERD (gastroesophageal reflux disease)   . Hyperlipidemia   . Hypertension   . CVA (cerebral vascular accident) 11/2004  . TIA (transient ischemic attack)     hospitalized 12/02, brain MRI 12/02  . MVP (mitral valve prolapse)     Echo- negative 11/04  . Osteoarthritis   . Obstruction of bowel 3/09    Laparatomy and enterolysis- Dr. Renda Rolls    Past Surgical History  Procedure Date  . Appendectomy   . Replacement total knee 1999    right  . Rotator cuff repair     right  . Cataract extraction 2000    with IOL    Family History  Problem Relation Age of Onset  . Alzheimer's disease Mother   . Diabetes Mother   . Diabetes Sister   . Coronary artery disease Brother     CABG x 5  . Hypertension Neg Hx   . Cancer Neg Hx     no breast or colon cancer  . Diabetes Brother       History   Social History  . Marital Status: Widowed    Spouse Name: N/A    Number of Children: 2  . Years of Education: N/A   Occupational History  . housewife    Social History Main Topics  . Smoking status: Former Games developer  . Smokeless tobacco: Not on file  . Alcohol Use: Not on file  . Drug Use: No  . Sexually Active: Not on file   Other Topics Concern  . Not on file   Social History Narrative  . No narrative on file   Review of Systems No dysuria or urinary freq Needed abdominal surgery by Dr Renda Rolls some years ago due to partial obstruction No cough or SOB     Objective:   Physical Exam  Constitutional: She appears well-developed and well-nourished. No distress.  Neck: Normal range of motion.  Pulmonary/Chest: Effort normal and breath sounds normal. No respiratory distress. She has no wheezes. She has no rales.  Abdominal: Soft. She exhibits no mass. There is no rebound and no guarding.       Hyperactive bowel sounds Mild RLQ  and LLQ tenderness  Lymphadenopathy:    She has no cervical adenopathy.          Assessment & Plan:

## 2010-12-19 ENCOUNTER — Encounter: Payer: Self-pay | Admitting: Internal Medicine

## 2010-12-19 ENCOUNTER — Ambulatory Visit (INDEPENDENT_AMBULATORY_CARE_PROVIDER_SITE_OTHER): Payer: Medicare Other | Admitting: Internal Medicine

## 2010-12-19 DIAGNOSIS — K5900 Constipation, unspecified: Secondary | ICD-10-CM

## 2010-12-19 DIAGNOSIS — I69998 Other sequelae following unspecified cerebrovascular disease: Secondary | ICD-10-CM

## 2010-12-19 DIAGNOSIS — E785 Hyperlipidemia, unspecified: Secondary | ICD-10-CM

## 2010-12-19 DIAGNOSIS — I10 Essential (primary) hypertension: Secondary | ICD-10-CM

## 2010-12-19 LAB — BASIC METABOLIC PANEL
Calcium: 9.1 mg/dL (ref 8.4–10.5)
GFR: 66.87 mL/min (ref >60.00)
Glucose, Bld: 101 mg/dL — ABNORMAL HIGH (ref 70–99)
Sodium: 142 mEq/L (ref 135–145)

## 2010-12-19 LAB — CBC WITH DIFFERENTIAL/PLATELET
Basophils Absolute: 0 10*3/uL (ref 0.0–0.1)
Hemoglobin: 14 g/dL (ref 12.0–15.0)
Lymphocytes Relative: 22.8 % (ref 12.0–46.0)
Monocytes Relative: 8.3 % (ref 3.0–12.0)
Neutro Abs: 4.4 10*3/uL (ref 1.4–7.7)
RBC: 4.67 Mil/uL (ref 3.87–5.11)
RDW: 14.6 % (ref 11.5–14.6)
WBC: 6.6 10*3/uL (ref 4.5–10.5)

## 2010-12-19 LAB — LIPID PANEL
HDL: 58.3 mg/dL (ref >39.00)
LDL Cholesterol: 77 mg/dL (ref 0–99)
Total CHOL/HDL Ratio: 3
VLDL: 11.6 mg/dL (ref 0.0–40.0)

## 2010-12-19 LAB — HEPATIC FUNCTION PANEL
Alkaline Phosphatase: 82 U/L (ref 39–117)
Bilirubin, Direct: 0.1 mg/dL (ref 0.0–0.3)
Total Protein: 7.4 g/dL (ref 6.0–8.3)

## 2010-12-19 NOTE — Progress Notes (Signed)
Subjective:    Patient ID: Tracy Novak, female    DOB: 1919-05-16, 77 y.o.   MRN: [id number]  HPI Stomach is better May have been a virus Still with some constipation--does use stool softeners at times (sennakot-s)  No new weakness No speech or swallowing problems Some increased troubles walking--hasn't been walking much due to heat Uses walker when walking around the grounds  No chest pain No SOB occ mild edema if feet are down all day. Better with elevation  No problems with statin  Current Outpatient Prescriptions on File Prior to Visit  Medication Sig Dispense Refill  . amLODipine (NORVASC) 10 MG tablet Take 10 mg by mouth daily.        Marland Kitchen aspirin 81 MG tablet Take 81 mg by mouth daily.       . pravastatin (PRAVACHOL) 40 MG tablet Take 40 mg by mouth at bedtime.          Allergies  Allergen Reactions  . Cephalexin     REACTION: unspecified  . Hydrocodone-Acetaminophen     REACTION: nausea  . Penicillins     REACTION: unspecified  . Sulfonamide Derivatives     REACTION: unspecified    Past Medical History  Diagnosis Date  . History of colon polyps   . Diverticulosis   . GERD (gastroesophageal reflux disease)   . Hyperlipidemia   . Hypertension   . CVA (cerebral vascular accident) 11/2004  . TIA (transient ischemic attack)     hospitalized 12/02, brain MRI 12/02  . MVP (mitral valve prolapse)     Echo- negative 11/04  . Osteoarthritis   . Obstruction of bowel 3/09    Laparatomy and enterolysis- Dr. Renda Rolls    Past Surgical History  Procedure Date  . Appendectomy   . Replacement total knee 1999    right  . Rotator cuff repair     right  . Cataract extraction 2000    with IOL    Family History  Problem Relation Age of Onset  . Alzheimer's disease Mother   . Diabetes Mother   . Diabetes Sister   . Coronary artery disease Brother     CABG x 5  . Hypertension Neg Hx   . Cancer Neg Hx     no breast or colon cancer  . Diabetes Brother      History   Social History  . Marital Status: Widowed    Spouse Name: N/A    Number of Children: 2  . Years of Education: N/A   Occupational History  . housewife    Social History Main Topics  . Smoking status: Former Games developer  . Smokeless tobacco: Not on file  . Alcohol Use: Not on file  . Drug Use: No  . Sexually Active: Not on file   Other Topics Concern  . Not on file   Social History Narrative  . No narrative on file   Review of Systems Sleeps well--6 hours and occ nap in day Appetite is fine Weight is down slightly--she has been working on this     Objective:   Physical Exam  Constitutional: She appears well-developed and well-nourished. No distress.  Neck: Normal range of motion. Neck supple. No thyromegaly present.  Cardiovascular: Normal rate, regular rhythm, normal heart sounds and intact distal pulses.  Exam reveals no gallop.   No murmur heard. Pulmonary/Chest: Effort normal and breath sounds normal. No respiratory distress. She has no wheezes. She has no rales.  Abdominal: Soft. There  is no tenderness.  Musculoskeletal: She exhibits no edema and no tenderness.  Lymphadenopathy:    She has no cervical adenopathy.  Psychiatric: She has a normal mood and affect. Her behavior is normal. Judgment and thought content normal.          Assessment & Plan:

## 2010-12-19 NOTE — Assessment & Plan Note (Signed)
Mild gait problems but generally does well On aspirin, statin, BP meds

## 2010-12-19 NOTE — Assessment & Plan Note (Signed)
BP Readings from Last 3 Encounters:  12/19/10 132/84  09/30/10 138/70  07/02/10 150/70   Good control  Due for labs

## 2010-12-19 NOTE — Assessment & Plan Note (Signed)
No problems with meds Due for labs 

## 2010-12-19 NOTE — Assessment & Plan Note (Signed)
Discussed changing colace to senna S regularly

## 2011-01-23 ENCOUNTER — Telehealth: Payer: Self-pay | Admitting: *Deleted

## 2011-01-23 NOTE — Telephone Encounter (Signed)
Patient requesting RX for walker. She is going on a cruise soon and will need a walker.

## 2011-01-26 NOTE — Telephone Encounter (Signed)
.  left message to have patient return my call.  

## 2011-01-26 NOTE — Telephone Encounter (Signed)
Straight walker? Rolling walker? Lightweight?

## 2011-01-28 NOTE — Telephone Encounter (Signed)
Pt would like a rolling walker with a seat, needs one that folds.  Please call pt when ready.

## 2011-01-28 NOTE — Telephone Encounter (Signed)
Left message on VM asking what kind of walker, advised pt to return my call.

## 2011-01-29 NOTE — Telephone Encounter (Signed)
Left message on machine for patient to call back.

## 2011-01-29 NOTE — Telephone Encounter (Signed)
Rx written May need copy of last note documenting need for walker

## 2011-03-25 ENCOUNTER — Ambulatory Visit (INDEPENDENT_AMBULATORY_CARE_PROVIDER_SITE_OTHER): Payer: Medicare Other | Admitting: *Deleted

## 2011-03-25 DIAGNOSIS — Z23 Encounter for immunization: Secondary | ICD-10-CM

## 2011-04-08 ENCOUNTER — Other Ambulatory Visit: Payer: Self-pay | Admitting: *Deleted

## 2011-04-08 MED ORDER — AMLODIPINE BESYLATE 10 MG PO TABS: 10.0000 mg | ORAL_TABLET | Freq: Every day | ORAL | Status: DC

## 2011-04-08 MED ORDER — PRAVASTATIN SODIUM 40 MG PO TABS: 40.0000 mg | ORAL_TABLET | Freq: Every day | ORAL | Status: DC

## 2011-04-08 NOTE — Telephone Encounter (Signed)
Patient calling asking for 30 day supply of [facility]asc [facility] and 90 day supply of Norvasc and pravastatin [facility]. All rx's sent

## 2011-04-10 ENCOUNTER — Telehealth: Payer: Self-pay | Admitting: Internal Medicine

## 2011-04-10 MED ORDER — AMLODIPINE BESYLATE 10 MG PO TABS: 10.0000 mg | ORAL_TABLET | Freq: Every day | ORAL | Status: DC

## 2011-04-10 NOTE — Telephone Encounter (Signed)
Please call to see how she is doing Let her know that I believe the demand for meds for possible flu without a visit is poor medical practice and I will expect that they don't call again with that type of demands

## 2011-04-10 NOTE — Telephone Encounter (Signed)
Spoke with patient and she stated she was better, she's only took 2 tablets of the Tamiflu, per pt her voice is better and she feels a little better.

## 2011-04-10 NOTE — Telephone Encounter (Signed)
If she is better after 2 doses of tamiflu then she didn't have the flu

## 2011-04-10 NOTE — Telephone Encounter (Signed)
Triage Record Num: [id number] Operator: Baldomero Lamy Patient Name: Tracy Novak Call Date & Time: 04/09/2011 7:06:03PM Patient Phone: [phone] PCP: Rene Paci Patient Gender: Female PCP Fax : [phone] Patient DOB: February 15, 1920 [facility] Name[facility] Reason for Call: Pt triaged earlier; was to call office in the am (12/28) for appt for flu sxs. Son called back and was very angry and demanded to speak to MD on call. RN called Dr Guerry Bruin discussion, ok to call in Tamiflu 75 mg po BID for 5 days with the understanding that pt call back within 3-5 days if sxs are not improved for evaluation of secondary infection. Called and relayed this to Ms. Pickeral-she verbalized understanding. Called in Tamiflu as noted above to [facility], [phone]-spoke with Greggory Stallion, Rph. Called pt and relayed that Rx would be ready at 1950 and pharm. closed at 2100. Protocol(s) Used: Information Only Call; No Symptom Triage (Adult) Recommended Outcome per Protocol: Provide Information or Advice Only Reason for Outcome: Follow-up call to recent contact; no triage required. Information provided from past call documentation, approved references or experience. Care Advice: ~

## 2011-04-10 NOTE — Telephone Encounter (Signed)
Triage Record Num: [id number] Operator: Baldomero Lamy Patient Name: Tracy Novak Call Date & Time: 04/09/2011 6:07:06PM Patient Phone: [phone] PCP: Rene Paci Patient Gender: Female PCP Fax : [phone] Patient DOB: 03/09/1920 Practice [facility] Reason for Call: Caller: Thomas/Other; PCP: Rene Paci; CB#: [phone]; Call regarding Flu; Pt calling regarding flu sxs and wants Tamiflu. Pt refused triage. NO standing order for [place]fu unless evaluated. Pt to call first thing for appt. on 12/28. Protocol(s) Used: Medication Question Calls, No Triage (Adults) Recommended Outcome per Protocol: Call Provider within 24 Hours Reason for Outcome: Caller requesting a non urgent new prescription or refill and triager unable to refill per unit policy Care Advice: ~ 12/

## 2011-06-16 ENCOUNTER — Ambulatory Visit (INDEPENDENT_AMBULATORY_CARE_PROVIDER_SITE_OTHER): Payer: Medicare Other | Admitting: Internal Medicine

## 2011-06-16 ENCOUNTER — Encounter: Payer: Self-pay | Admitting: Internal Medicine

## 2011-06-16 DIAGNOSIS — I69998 Other sequelae following unspecified cerebrovascular disease: Secondary | ICD-10-CM

## 2011-06-16 DIAGNOSIS — E785 Hyperlipidemia, unspecified: Secondary | ICD-10-CM | POA: Diagnosis not present

## 2011-06-16 DIAGNOSIS — K5900 Constipation, unspecified: Secondary | ICD-10-CM

## 2011-06-16 DIAGNOSIS — I10 Essential (primary) hypertension: Secondary | ICD-10-CM | POA: Diagnosis not present

## 2011-06-16 NOTE — Assessment & Plan Note (Signed)
BP Readings from Last 3 Encounters:  06/16/11 142/78  12/19/10 132/84  09/30/10 138/70   Fairly good control  No changes needed Lab Results  Component Value Date   CREATININE 0.9 12/19/2010

## 2011-06-16 NOTE — Assessment & Plan Note (Signed)
Lab Results  Component Value Date   LDLCALC 77 12/19/2010   At goal--continue meds

## 2011-06-16 NOTE — Progress Notes (Signed)
Subjective:    Patient ID: Tracy Novak, female    DOB: 06-23-1919, 78 y.o.   MRN: [id number]  HPI Doing well Still enjoys the [employer] out with niece and next door neighbor, plays dominos and bridge, etc Goes to church weekly Still drives --seems to be doing okay. Son watches her when she drives Uses cane to get around---hasn't needed walker of late No falls  No chest pain No SOB No dizziness or fainting spells  Bowels are usually okay Doesn't use the senna much--just uses prunes  No myalgias except some isolated left arm soreness Discussed switching hands with cane  Current Outpatient Prescriptions on File Prior to Visit  Medication Sig Dispense Refill  . amLODipine (NORVASC) 10 MG tablet Take 1 tablet (10 mg total) by mouth daily.  90 tablet  3  . aspirin 81 MG tablet Take 81 mg by mouth daily.       . pravastatin (PRAVACHOL) 40 MG tablet Take 1 tablet (40 mg total) by mouth at bedtime.  90 tablet  3  . sennosides-docusate sodium (SENOKOT-S) 8.6-50 MG tablet Take 2 tablets by mouth daily.          Allergies  Allergen Reactions  . Cephalexin     REACTION: unspecified  . Hydrocodone-Acetaminophen     REACTION: nausea  . Penicillins     REACTION: unspecified  . Sulfonamide Derivatives     REACTION: unspecified    Past Medical History  Diagnosis Date  . History of colon polyps   . Diverticulosis   . GERD (gastroesophageal reflux disease)   . Hyperlipidemia   . Hypertension   . CVA (cerebral vascular accident) 11/2004  . TIA (transient ischemic attack)     hospitalized 12/02, brain MRI 12/02  . MVP (mitral valve prolapse)     Echo- negative 11/04  . Osteoarthritis   . Obstruction of bowel 3/09    Laparatomy and enterolysis- Dr. Renda Rolls    Past Surgical History  Procedure Date  . Appendectomy   . Replacement total knee 1999    right  . Rotator cuff repair     right  . Cataract extraction 2000    with IOL    Family History  Problem Relation  Age of Onset  . Alzheimer's disease Mother   . Diabetes Mother   . Diabetes Sister   . Coronary artery disease Brother     CABG x 5  . Hypertension Neg Hx   . Cancer Neg Hx     no breast or colon cancer  . Diabetes Brother     History   Social History  . Marital Status: Widowed    Spouse Name: N/A    Number of Children: 2  . Years of Education: N/A   Occupational History  . housewife    Social History Main Topics  . Smoking status: Former Games developer  . Smokeless tobacco: Never Used  . Alcohol Use: Not on file  . Drug Use: No  . Sexually Active: Not on file   Other Topics Concern  . Not on file   Social History Narrative  . No narrative on file   Review of Systems Appetite is good Weight stable Sleeps okay. Not that many hours but is refreshed. Initiates 11PM or later. Up at 5AM     Objective:   Physical Exam  Constitutional: She appears well-developed and well-nourished. No distress.  Neck: Normal range of motion.  Cardiovascular: Normal rate and regular rhythm.  Exam  reveals no gallop.   Murmur heard.      Very soft murmur along sternal border  Pulmonary/Chest: Effort normal and breath sounds normal. No respiratory distress. She has no wheezes. She has no rales.  Abdominal: Soft. She exhibits no mass.  Musculoskeletal: She exhibits no edema and no tenderness.  Lymphadenopathy:    She has no cervical adenopathy.  Psychiatric: She has a normal mood and affect. Her behavior is normal. Thought content normal.          Assessment & Plan:

## 2011-06-16 NOTE — Assessment & Plan Note (Signed)
Stable status Mild balance issues--uses cane On asa and statin

## 2011-06-16 NOTE — Assessment & Plan Note (Signed)
Better with prunes Uses senna prn

## 2011-10-16 ENCOUNTER — Telehealth: Payer: Self-pay | Admitting: Internal Medicine

## 2011-10-16 DIAGNOSIS — J189 Pneumonia, unspecified organism: Secondary | ICD-10-CM | POA: Diagnosis not present

## 2011-10-16 DIAGNOSIS — R091 Pleurisy: Secondary | ICD-10-CM | POA: Diagnosis not present

## 2011-10-16 DIAGNOSIS — R05 Cough: Secondary | ICD-10-CM | POA: Diagnosis not present

## 2011-10-16 DIAGNOSIS — J9819 Other pulmonary collapse: Secondary | ICD-10-CM | POA: Diagnosis not present

## 2011-10-16 DIAGNOSIS — J984 Other disorders of lung: Secondary | ICD-10-CM | POA: Diagnosis not present

## 2011-10-16 NOTE — Telephone Encounter (Signed)
Caller: Terry/Child; PCP: Tillman Abide; CB#: [phone]; Calling today 10/16/11 regarding mother visiting him in state of [place], and having cough congestion.  Advised caller office does not call in ABX.  Caller requesting list of mother's allergies so he can take her to urgent care there.  Pulled chart in [facility] and medication allergies listed as Cephalexin, PCN, Hydrocodone/Acetaminophen, Sulfonamide derivatives.  Information relayed to caller.

## 2011-10-17 NOTE — Telephone Encounter (Signed)
Please check on her on Monday 

## 2011-10-20 NOTE — Telephone Encounter (Signed)
Okay I reviewed the records and radiologist did not feel there was pneumonia We will review at office visit

## 2011-10-20 NOTE — Telephone Encounter (Signed)
Spoke with son and she's doing ok, has pneumonia, taking doxycyline 100 mg, pt also taking a antihistamine and mucinex, everything working pretty good, now coughing up clear mucus, pt has appt on Friday 12:15pm

## 2011-10-23 ENCOUNTER — Ambulatory Visit (INDEPENDENT_AMBULATORY_CARE_PROVIDER_SITE_OTHER): Payer: Medicare Other | Admitting: Internal Medicine

## 2011-10-23 ENCOUNTER — Encounter: Payer: Self-pay | Admitting: Internal Medicine

## 2011-10-23 VITALS — BP 128/78 | HR 79 | Temp 98.4°F | Resp 15 | Wt 140.0 lb

## 2011-10-23 DIAGNOSIS — J209 Acute bronchitis, unspecified: Secondary | ICD-10-CM | POA: Insufficient documentation

## 2011-10-23 NOTE — Assessment & Plan Note (Signed)
Productive cough with low grade fever Formal radiology report was not pneumonia At this point, best to finish out antibiotic now Doing better

## 2011-10-23 NOTE — Progress Notes (Signed)
Subjective:    Patient ID: Tracy Novak, female    DOB: 1919/09/12, 78 y.o.   MRN: [id number]  HPI On trip to [place] for grandson's wedding Developed fever and felt bad Went to urgent care and diagnosed with pneumonia Radiologist didn't feel it was pneumonia  Still on the antibiotic Doxycycline 100 bid Doesn't feel bad now---just thought it was bad cold  Spells of cough Some green phlegm which is improving---getting lighter No sig SOB  Current Outpatient Prescriptions on File Prior to Visit  Medication Sig Dispense Refill  . amLODipine (NORVASC) 10 MG tablet Take 1 tablet (10 mg total) by mouth daily.  90 tablet  3  . aspirin 81 MG tablet Take 81 mg by mouth daily.       . pravastatin (PRAVACHOL) 40 MG tablet Take 1 tablet (40 mg total) by mouth at bedtime.  90 tablet  3  . sennosides-docusate sodium (SENOKOT-S) 8.6-50 MG tablet Take 2 tablets by mouth daily.          Allergies  Allergen Reactions  . Cephalexin     REACTION: unspecified  . Hydrocodone-Acetaminophen     REACTION: nausea  . Penicillins     REACTION: unspecified  . Sulfonamide Derivatives     REACTION: unspecified    Past Medical History  Diagnosis Date  . History of colon polyps   . Diverticulosis   . GERD (gastroesophageal reflux disease)   . Hyperlipidemia   . Hypertension   . CVA (cerebral vascular accident) 11/2004  . TIA (transient ischemic attack)     hospitalized 12/02, brain MRI 12/02  . MVP (mitral valve prolapse)     Echo- negative 11/04  . Osteoarthritis   . Obstruction of bowel 3/09    Laparatomy and enterolysis- Dr. Renda Rolls    Past Surgical History  Procedure Date  . Appendectomy   . Replacement total knee 1999    right  . Rotator cuff repair     right  . Cataract extraction 2000    with IOL    Family History  Problem Relation Age of Onset  . Alzheimer's disease Mother   . Diabetes Mother   . Diabetes Sister   . Coronary artery disease Brother     CABG x 5    . Hypertension Neg Hx   . Cancer Neg Hx     no breast or colon cancer  . Diabetes Brother     History   Social History  . Marital Status: Widowed    Spouse Name: N/A    Number of Children: 2  . Years of Education: N/A   Occupational History  . housewife    Social History Main Topics  . Smoking status: Former Games developer  . Smokeless tobacco: Never Used  . Alcohol Use: Not on file  . Drug Use: No  . Sexually Active: Not on file   Other Topics Concern  . Not on file   Social History Narrative  . No narrative on file   Review of Systems No stomach trouble Appetite is fair---down a bit No nausea or vomiting No rash    Objective:   Physical Exam  Constitutional: She appears well-developed and well-nourished. No distress.  HENT:  Mouth/Throat: Oropharynx is clear and moist. No oropharyngeal exudate.       TMs normal Mild nasal congestion  Neck: Normal range of motion. Neck supple.  Pulmonary/Chest: Effort normal and breath sounds normal. No respiratory distress. She has no wheezes. She has no  rales.       No dullness  Lymphadenopathy:    She has no cervical adenopathy.          Assessment & Plan:

## 2011-12-17 ENCOUNTER — Ambulatory Visit: Payer: Medicare Other | Admitting: Internal Medicine

## 2011-12-31 ENCOUNTER — Ambulatory Visit (INDEPENDENT_AMBULATORY_CARE_PROVIDER_SITE_OTHER): Payer: Medicare Other | Admitting: Internal Medicine

## 2011-12-31 ENCOUNTER — Encounter: Payer: Self-pay | Admitting: Internal Medicine

## 2011-12-31 VITALS — BP 138/78 | HR 85 | Temp 98.0°F | Ht 61.0 in | Wt 139.0 lb

## 2011-12-31 DIAGNOSIS — E785 Hyperlipidemia, unspecified: Secondary | ICD-10-CM | POA: Diagnosis not present

## 2011-12-31 DIAGNOSIS — I10 Essential (primary) hypertension: Secondary | ICD-10-CM

## 2011-12-31 DIAGNOSIS — I69998 Other sequelae following unspecified cerebrovascular disease: Secondary | ICD-10-CM

## 2011-12-31 DIAGNOSIS — Z23 Encounter for immunization: Secondary | ICD-10-CM | POA: Diagnosis not present

## 2011-12-31 LAB — HEPATIC FUNCTION PANEL
Alkaline Phosphatase: 73 U/L (ref 39–117)
Bilirubin, Direct: 0.1 mg/dL (ref 0.0–0.3)
Total Bilirubin: 0.6 mg/dL (ref 0.3–1.2)
Total Protein: 7.7 g/dL (ref 6.0–8.3)

## 2011-12-31 LAB — CBC WITH DIFFERENTIAL/PLATELET
Basophils Absolute: 0 10*3/uL (ref 0.0–0.1)
Eosinophils Absolute: 0.3 10*3/uL (ref 0.0–0.7)
Lymphocytes Relative: 24.1 % (ref 12.0–46.0)
MCHC: 32.5 g/dL (ref 30.0–36.0)
Monocytes Relative: 9.5 % (ref 3.0–12.0)
Neutro Abs: 5 10*3/uL (ref 1.4–7.7)
Neutrophils Relative %: 62.6 % (ref 43.0–77.0)
Platelets: 214 10*3/uL (ref 150.0–400.0)
RDW: 14.8 % — ABNORMAL HIGH (ref 11.5–14.6)

## 2011-12-31 LAB — BASIC METABOLIC PANEL
BUN: 22 mg/dL (ref 6–23)
CO2: 27 mEq/L (ref 19–32)
Calcium: 9.4 mg/dL (ref 8.4–10.5)
Creatinine, Ser: 1 mg/dL (ref 0.4–1.2)
GFR: 53.45 mL/min — ABNORMAL LOW (ref >60.00)
Glucose, Bld: 105 mg/dL — ABNORMAL HIGH (ref 70–99)
Sodium: 141 mEq/L (ref 135–145)

## 2011-12-31 LAB — LIPID PANEL
HDL: 52.6 mg/dL (ref >39.00)
Triglycerides: 102 mg/dL (ref 0.0–149.0)
VLDL: 20.4 mg/dL (ref 0.0–40.0)

## 2011-12-31 LAB — TSH: TSH: 1.53 u[IU]/mL (ref 0.35–5.50)

## 2011-12-31 NOTE — Assessment & Plan Note (Signed)
Had some mild right sided weakness but mostly better ?slight cognitive issues (not major)

## 2011-12-31 NOTE — Progress Notes (Signed)
Subjective:    Patient ID: Tracy Novak, female    DOB: 08-03-1919, 78 y.o.   MRN: [id number]  HPI Doing well  Feels her walking is not as good as a year ago---but still tries to walk twice a day around grounds at [place]. 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 amLODipine (NORVASC) 10 MG tablet Take 1 tablet (10 mg total) by mouth daily.  90 tablet  3  . aspirin 81 MG tablet Take 81 mg by mouth daily.       . pravastatin (PRAVACHOL) 40 MG tablet Take 1 tablet (40 mg total) by mouth at bedtime.  90 tablet  3  . sennosides-docusate sodium (SENOKOT-S) 8.6-50 MG tablet Take 2 tablets by mouth daily.          Allergies  Allergen Reactions  . Cephalexin     REACTION: unspecified  . Hydrocodone-Acetaminophen     REACTION: nausea  . Penicillins     REACTION: unspecified  . Sulfonamide Derivatives     REACTION: unspecified    Past Medical History  Diagnosis Date  . History of colon polyps   . Diverticulosis   . GERD (gastroesophageal reflux disease)   . Hyperlipidemia   . Hypertension   . CVA (cerebral vascular accident) 11/2004  . TIA (transient ischemic attack)     hospitalized 12/02, brain MRI 12/02  . MVP (mitral valve prolapse)     Echo- negative 11/04  . Osteoarthritis   . Obstruction of bowel 3/09    Laparatomy and enterolysis- Dr. Renda Rolls    Past Surgical History  Procedure Date  . Appendectomy   . Replacement total knee 1999    right  . Rotator cuff repair     right  . Cataract extraction 2000    with IOL    Family History  Problem  Relation Age of Onset  . Alzheimer's disease Mother   . Diabetes Mother   . Diabetes Sister   . Coronary artery disease Brother     CABG x 5  . Hypertension Neg Hx   . Cancer Neg Hx     no breast or colon cancer  . Diabetes Brother     History   Social History  . Marital Status: Widowed    Spouse Name: N/A    Number of Children: 2  . Years of Education: N/A   Occupational History  . housewife    Social History Main Topics  . Smoking status: Former Games developer  . Smokeless tobacco: Never Used  . Alcohol Use: Not on file  . Drug Use: No  . Sexually Active: Not on file   Other Topics Concern  . Not on file   Social History Narrative  . No narrative on file   Review of Systems Still limitations of right shoulder--this was her stroke side sleeps well Appetite is fine Weight about the same    Objective:   Physical Exam  Constitutional: She appears well-developed and well-nourished. No distress.  Neck: Normal range of motion. Neck supple. No thyromegaly present.  Cardiovascular: Normal rate,  regular rhythm and normal heart sounds.  Exam reveals no gallop.   No murmur heard. Pulmonary/Chest: Effort normal and breath sounds normal. No respiratory distress. She has no wheezes. She has no rales.  Abdominal: Soft. There is no tenderness.  Musculoskeletal: She exhibits no edema and no tenderness.  Lymphadenopathy:    She has no cervical adenopathy.  Psychiatric: She has a normal mood and affect. Her behavior is normal.          Assessment & Plan:

## 2011-12-31 NOTE — Assessment & Plan Note (Signed)
BP Readings from Last 3 Encounters:  12/31/11 138/78  10/23/11 128/78  06/16/11 142/78   Good control No changes needed Due for labs

## 2011-12-31 NOTE — Assessment & Plan Note (Signed)
No problems with the med Due for labs 

## 2012-01-01 ENCOUNTER — Encounter: Payer: Self-pay | Admitting: *Deleted

## 2012-03-08 ENCOUNTER — Other Ambulatory Visit: Payer: Self-pay | Admitting: Internal Medicine

## 2012-03-13 ENCOUNTER — Emergency Department: Payer: Self-pay | Admitting: Emergency Medicine

## 2012-03-13 DIAGNOSIS — M25559 Pain in unspecified hip: Secondary | ICD-10-CM | POA: Diagnosis not present

## 2012-03-13 DIAGNOSIS — S0993XA Unspecified injury of face, initial encounter: Secondary | ICD-10-CM | POA: Diagnosis not present

## 2012-03-13 DIAGNOSIS — T148XXA Other injury of unspecified body region, initial encounter: Secondary | ICD-10-CM | POA: Diagnosis not present

## 2012-03-13 DIAGNOSIS — S1093XA Contusion of unspecified part of neck, initial encounter: Secondary | ICD-10-CM | POA: Diagnosis not present

## 2012-03-13 DIAGNOSIS — S199XXA Unspecified injury of neck, initial encounter: Secondary | ICD-10-CM | POA: Diagnosis not present

## 2012-03-13 DIAGNOSIS — S0003XA Contusion of scalp, initial encounter: Secondary | ICD-10-CM | POA: Diagnosis not present

## 2012-03-13 DIAGNOSIS — S0083XA Contusion of other part of head, initial encounter: Secondary | ICD-10-CM | POA: Diagnosis not present

## 2012-03-13 DIAGNOSIS — S7010XA Contusion of unspecified thigh, initial encounter: Secondary | ICD-10-CM | POA: Diagnosis not present

## 2012-03-13 DIAGNOSIS — S0180XA Unspecified open wound of other part of head, initial encounter: Secondary | ICD-10-CM | POA: Diagnosis not present

## 2012-03-15 ENCOUNTER — Telehealth: Payer: Self-pay

## 2012-03-15 NOTE — Telephone Encounter (Signed)
Pt request handicap placard form filled out. Call when ready for pick up.(Form is on Dr Karle Starch desk in the in box.)

## 2012-03-16 NOTE — Telephone Encounter (Signed)
Form done No charge 

## 2012-03-16 NOTE — Telephone Encounter (Signed)
I notified patient that form is ready to be picked up.

## 2012-03-22 ENCOUNTER — Encounter: Payer: Self-pay | Admitting: Internal Medicine

## 2012-03-22 ENCOUNTER — Ambulatory Visit (INDEPENDENT_AMBULATORY_CARE_PROVIDER_SITE_OTHER): Payer: Medicare Other | Admitting: Internal Medicine

## 2012-03-22 VITALS — BP 138/80 | HR 69 | Temp 97.5°F | Wt 143.0 lb

## 2012-03-22 DIAGNOSIS — S1093XA Contusion of unspecified part of neck, initial encounter: Secondary | ICD-10-CM | POA: Diagnosis not present

## 2012-03-22 DIAGNOSIS — S0083XA Contusion of other part of head, initial encounter: Secondary | ICD-10-CM

## 2012-03-22 DIAGNOSIS — S0003XA Contusion of scalp, initial encounter: Secondary | ICD-10-CM | POA: Diagnosis not present

## 2012-03-22 NOTE — Progress Notes (Signed)
  Subjective:    Patient ID: Tracy Novak, female    DOB: 06/29/1919, 78 y.o.   MRN: [id number]  HPI Here with son  Micah Flesher to church Getting out of car and was stepping over concrete parking border Lots of bleeding from face. Hard swelling of right cheek Got stitches at [facility] CT benign  Hit leg and x-rays okay there Still with bruising in leg Current Outpatient Prescriptions on File Prior to Visit  Medication Sig Dispense Refill  . amLODipine (NORVASC) 10 MG tablet Take 1 tablet (10 mg total) by mouth daily.  90 tablet  3  . aspirin 81 MG tablet Take 81 mg by mouth daily.       . pravastatin (PRAVACHOL) 40 MG tablet TAKE 1 TABLET AT BEDTIME  90 tablet  PRN  . sennosides-docusate sodium (SENOKOT-S) 8.6-50 MG tablet Take 2 tablets by mouth daily.          Allergies  Allergen Reactions  . Cephalexin     REACTION: unspecified  . Hydrocodone-Acetaminophen     REACTION: nausea  . Penicillins     REACTION: unspecified  . Sulfonamide Derivatives     REACTION: unspecified    Past Medical History  Diagnosis Date  . History of colon polyps   . Diverticulosis   . GERD (gastroesophageal reflux disease)   . Hyperlipidemia   . Hypertension   . CVA (cerebral vascular accident) 11/2004  . TIA (transient ischemic attack)     hospitalized 12/02, brain MRI 12/02  . MVP (mitral valve prolapse)     Echo- negative 11/04  . Osteoarthritis   . Obstruction of bowel 3/09    Laparatomy and enterolysis- Dr. Renda Rolls    Past Surgical History  Procedure Date  . Appendectomy   . Replacement total knee 1999    right  . Rotator cuff repair     right  . Cataract extraction 2000    with IOL    Family History  Problem Relation Age of Onset  . Alzheimer's disease Mother   . Diabetes Mother   . Diabetes Sister   . Coronary artery disease Brother     CABG x 5  . Hypertension Neg Hx   . Cancer Neg Hx     no breast or colon cancer  . Diabetes Brother     History   Social  History  . Marital Status: Widowed    Spouse Name: N/A    Number of Children: 2  . Years of Education: N/A   Occupational History  . housewife    Social History Main Topics  . Smoking status: Former Games developer  . Smokeless tobacco: Never Used  . Alcohol Use: Not on file  . Drug Use: No  . Sexually Active: Not on file   Other Topics Concern  . Not on file   Social History Narrative  . No narrative on file   Review of Systems No headaches Appetite is okay    Objective:   Physical Exam  HENT:       Hard hematoma in right cheek Not tender  Laceration well healed along left lateral eye border 5 sutures removed  Musculoskeletal:       Bruising in legs---right >left Normal ROM  Neurological:       No leg weakness          Assessment & Plan:

## 2012-03-22 NOTE — Assessment & Plan Note (Signed)
Sutures removed without difficulty (they were tight) Reassured about bruising in leg and hematoma in cheek Will take a long time to resolved  Discussed starting strength and balance training at [facility]

## 2012-05-17 ENCOUNTER — Other Ambulatory Visit: Payer: Self-pay | Admitting: Internal Medicine

## 2012-06-02 ENCOUNTER — Telehealth: Payer: Self-pay | Admitting: Internal Medicine

## 2012-06-02 NOTE — Telephone Encounter (Signed)
Patient Information:  Caller Name: Tracy Novak  Phone: [phone]  Patient: Tracy Novak, Tracy Novak  Gender: Female  DOB: 22-May-1919  Age: 79 Years  PCP: Tillman Abide [facility])  Office Follow Up:  Does the office need to follow up with this patient?: Yes  Instructions For The Office: Request Rx for anxiety/ unable to sleep.  RN Note:  Reviewed anxiety protocol with son.  Patient is having difficulty coping with this one subject. No other s/sx of depression /phobias and anxiety. Requesting medication be sent to [facility] at [facility] in [place]. Advised I would forward message to the ofice . Encouraged to call back for questions, changes or concern.  LOV 03/22/12  Symptoms  Reason For Call & Symptoms: He is calling in regards to his mother.  He reports a stroke 8-9 years ago (brought on by stress).  It has now came to the point where she should no longer drive at 79 years old.  He states she put her car up for sale and her other son is mad and angry.  It has greatly upset her . She is upset , did not sleep . Mr. Wollenberg is afraid that she is going to have another stroke episode and is requesting something to calm and relax her .  Reviewed Health History In EMR: Yes  Reviewed Medications In EMR: Yes  Reviewed Allergies In EMR: Yes  Reviewed Surgeries / Procedures: No  Date of Onset of Symptoms: 05/26/2012  Guideline(s) Used:  No Protocol Available - Information Only  Disposition Per Guideline:   Discuss with PCP and Callback by Nurse Today  Reason For Disposition Reached:   Nursing judgment  Advice Given:  Call Back If:  New symptoms develop  You become worse.

## 2012-06-02 NOTE — Telephone Encounter (Signed)
Discussed with patient and then son She sounds okay now We are both concerned about side effects so will hold off on any meds Told her to call in AM if she has another bad night---could consider small quantity of xanax just to calm her at bedtime  Discussed plan and rationale with son and he agreed

## 2012-06-29 ENCOUNTER — Encounter: Payer: Self-pay | Admitting: Internal Medicine

## 2012-06-29 ENCOUNTER — Ambulatory Visit (INDEPENDENT_AMBULATORY_CARE_PROVIDER_SITE_OTHER): Payer: Medicare Other | Admitting: Internal Medicine

## 2012-06-29 VITALS — BP 120/80 | HR 64 | Temp 97.9°F | Wt 142.0 lb

## 2012-06-29 DIAGNOSIS — I10 Essential (primary) hypertension: Secondary | ICD-10-CM | POA: Diagnosis not present

## 2012-06-29 DIAGNOSIS — I69998 Other sequelae following unspecified cerebrovascular disease: Secondary | ICD-10-CM

## 2012-06-29 DIAGNOSIS — Z1331 Encounter for screening for depression: Secondary | ICD-10-CM

## 2012-06-29 NOTE — Assessment & Plan Note (Signed)
Stable status On aspirin and statin BP fine

## 2012-06-29 NOTE — Progress Notes (Signed)
Subjective:    Patient ID: Tracy Novak, female    DOB: [birth year], 79 y.o.   MRN: [id number]  HPI Here with son Still loves it at the [facility] around regularly---uses walker to go faster No falls since the last visit---and that was tripping over the parking curb  Mild memory problems No progression No loss of function Son not concerned  No chest pain No SOB. Has mild DOE if pushes it while walking with walker No dizziness unless she gets up very fast No edema  Stomach fine No recent heartburn  Current Outpatient Prescriptions on File Prior to Visit  Medication Sig Dispense Refill  . amLODipine (NORVASC) 10 MG tablet TAKE 1 TABLET EVERY DAY  90 tablet  PRN  . aspirin 81 MG tablet Take 81 mg by mouth daily.       . pravastatin (PRAVACHOL) 40 MG tablet TAKE 1 TABLET AT BEDTIME  90 tablet  PRN   No current facility-administered medications on file prior to visit.    Allergies  Allergen Reactions  . Cephalexin Rash    " peeled my skin off" per patient  . Hydrocodone-Acetaminophen     REACTION: nausea  . Penicillins     REACTION: unspecified  . Sulfonamide Derivatives     REACTION: unspecified    Past Medical History  Diagnosis Date  . History of colon polyps   . Diverticulosis   . GERD (gastroesophageal reflux disease)   . Hyperlipidemia   . Hypertension   . CVA (cerebral vascular accident) 11/2004  . TIA (transient ischemic attack)     hospitalized 12/02, brain MRI 12/02  . MVP (mitral valve prolapse)     Echo- negative 11/04  . Osteoarthritis   . Obstruction of bowel 3/09    Laparatomy and enterolysis- Dr. Renda Rolls    Past Surgical History  Procedure Laterality Date  . Appendectomy    . Replacement total knee  1999    right  . Rotator cuff repair      right  . Cataract extraction  2000    with IOL    Family History  Problem Relation Age of Onset  . Alzheimer's disease Mother   . Diabetes Mother   . Diabetes Sister   . Coronary artery  disease Brother     CABG x 5  . Hypertension Neg Hx   . Cancer Neg Hx     no breast or colon cancer  . Diabetes Brother     History   Social History  . Marital Status: Widowed    Spouse Name: N/A    Number of Children: 2  . Years of Education: N/A   Occupational History  . housewife    Social History Main Topics  . Smoking status: Former Games developer  . Smokeless tobacco: Never Used  . Alcohol Use: Not on file  . Drug Use: No  . Sexually Active: Not on file   Other Topics Concern  . Not on file   Social History Narrative  . No narrative on file   Review of Systems Appetite is "too good" Weight is stable  Doesn't sleep a lot---awakens rested Really enjoys life still Has lump on lower right eyelid Red area on left nare    Objective:   Physical Exam  Constitutional: She appears well-developed and well-nourished. No distress.  Eyes:  ?chalazion in right medial lower lid  Neck: Normal range of motion. Neck supple. No thyromegaly present.  Cardiovascular: Normal rate, regular rhythm and  normal heart sounds.  Exam reveals no gallop.   No murmur heard. Pulmonary/Chest: Effort normal and breath sounds normal. No respiratory distress. She has no wheezes. She has no rales.  Abdominal: Soft. There is no tenderness.  Musculoskeletal: She exhibits no edema and no tenderness.  Lymphadenopathy:    She has no cervical adenopathy.  Skin:  Suspicious red lesion on left nare--- will go to derm for eval  Psychiatric: She has a normal mood and affect. Her behavior is normal.          Assessment & Plan:

## 2012-06-29 NOTE — Patient Instructions (Signed)
Please set up appointments with the eye doctor and dermatologist

## 2012-06-29 NOTE — Assessment & Plan Note (Signed)
BP Readings from Last 3 Encounters:  06/29/12 120/80  03/22/12 138/80  12/31/11 138/78   Good control No changes needed

## 2012-07-01 DIAGNOSIS — C44111 Basal cell carcinoma of skin of unspecified eyelid, including canthus: Secondary | ICD-10-CM | POA: Diagnosis not present

## 2012-07-01 DIAGNOSIS — C44319 Basal cell carcinoma of skin of other parts of face: Secondary | ICD-10-CM | POA: Diagnosis not present

## 2012-07-01 DIAGNOSIS — B078 Other viral warts: Secondary | ICD-10-CM | POA: Diagnosis not present

## 2012-07-01 DIAGNOSIS — L821 Other seborrheic keratosis: Secondary | ICD-10-CM | POA: Diagnosis not present

## 2012-07-01 DIAGNOSIS — D485 Neoplasm of uncertain behavior of skin: Secondary | ICD-10-CM | POA: Diagnosis not present

## 2012-08-31 DIAGNOSIS — L908 Other atrophic disorders of skin: Secondary | ICD-10-CM | POA: Diagnosis not present

## 2012-08-31 DIAGNOSIS — C44319 Basal cell carcinoma of skin of other parts of face: Secondary | ICD-10-CM | POA: Diagnosis not present

## 2012-08-31 DIAGNOSIS — C44101 Unspecified malignant neoplasm of skin of unspecified eyelid, including canthus: Secondary | ICD-10-CM | POA: Diagnosis not present

## 2012-10-12 ENCOUNTER — Other Ambulatory Visit: Payer: Self-pay | Admitting: *Deleted

## 2012-10-12 MED ORDER — AMLODIPINE BESYLATE 10 MG PO TABS: 10.0000 mg | ORAL_TABLET | Freq: Every day | ORAL | Status: DC

## 2012-11-03 DIAGNOSIS — C4491 Basal cell carcinoma of skin, unspecified: Secondary | ICD-10-CM | POA: Diagnosis not present

## 2013-01-02 ENCOUNTER — Ambulatory Visit (INDEPENDENT_AMBULATORY_CARE_PROVIDER_SITE_OTHER): Payer: Medicare Other | Admitting: Internal Medicine

## 2013-01-02 ENCOUNTER — Encounter: Payer: Self-pay | Admitting: Internal Medicine

## 2013-01-02 VITALS — BP 148/80 | HR 64 | Temp 98.2°F | Wt 146.0 lb

## 2013-01-02 DIAGNOSIS — I10 Essential (primary) hypertension: Secondary | ICD-10-CM | POA: Diagnosis not present

## 2013-01-02 DIAGNOSIS — I69998 Other sequelae following unspecified cerebrovascular disease: Secondary | ICD-10-CM

## 2013-01-02 DIAGNOSIS — M1990 Unspecified osteoarthritis, unspecified site: Secondary | ICD-10-CM | POA: Diagnosis not present

## 2013-01-02 DIAGNOSIS — Z23 Encounter for immunization: Secondary | ICD-10-CM

## 2013-01-02 DIAGNOSIS — E785 Hyperlipidemia, unspecified: Secondary | ICD-10-CM

## 2013-01-02 LAB — BASIC METABOLIC PANEL
BUN: 20 mg/dL (ref 6–23)
Calcium: 9.1 mg/dL (ref 8.4–10.5)
GFR: 60.75 mL/min (ref >60.00)
Glucose, Bld: 95 mg/dL (ref 70–99)
Potassium: 3.9 mEq/L (ref 3.5–5.1)

## 2013-01-02 LAB — LIPID PANEL
Cholesterol: 149 mg/dL (ref 0–200)
HDL: 57.6 mg/dL (ref >39.00)
Triglycerides: 79 mg/dL (ref 0.0–149.0)
VLDL: 15.8 mg/dL (ref 0.0–40.0)

## 2013-01-02 LAB — CBC WITH DIFFERENTIAL/PLATELET
Basophils Absolute: 0 10*3/uL (ref 0.0–0.1)
HCT: 41.7 % (ref 36.0–46.0)
Lymphs Abs: 1.6 10*3/uL (ref 0.7–4.0)
Monocytes Relative: 8.8 % (ref 3.0–12.0)
Platelets: 202 10*3/uL (ref 150.0–400.0)
RDW: 14.1 % (ref 11.5–14.6)

## 2013-01-02 LAB — HEPATIC FUNCTION PANEL
Albumin: 4.2 g/dL (ref 3.5–5.2)
Alkaline Phosphatase: 68 U/L (ref 39–117)

## 2013-01-02 LAB — TSH: TSH: 1.21 u[IU]/mL (ref 0.35–5.50)

## 2013-01-02 NOTE — Assessment & Plan Note (Signed)
BP Readings from Last 3 Encounters:  01/02/13 148/80  06/29/12 120/80  03/22/12 138/80   Good control for age No changes Due for labs

## 2013-01-02 NOTE — Progress Notes (Signed)
Subjective:    Patient ID: Tracy Novak, female    DOB: [birth year], 79 y.o.   MRN: [id number]  HPI Doing great Here with friend  Still likes it at her place-- [facility] Food is variable Not doing as much walking there though---less energy Larey Seat last night---no injury  Ongoing knee problems Pain is variable Uses horse linament Not sure she is ready for the left TKR--had right done [facility] with the walker---or cane at times  No chest pain Some DOE when walking-- improves with brief rest  Current Outpatient Prescriptions on File Prior to Visit  Medication Sig Dispense Refill  . amLODipine (NORVASC) 10 MG tablet Take 1 tablet (10 mg total) by mouth daily.  90 tablet  3  . aspirin 81 MG tablet Take 81 mg by mouth daily.       . pravastatin (PRAVACHOL) 40 MG tablet TAKE 1 TABLET AT BEDTIME  90 tablet  PRN   No current facility-administered medications on file prior to visit.    Allergies  Allergen Reactions  . Cephalexin Rash    " peeled my skin off" per patient  . Hydrocodone-Acetaminophen     REACTION: nausea  . Penicillins     REACTION: unspecified  . Sulfonamide Derivatives     REACTION: unspecified    Past Medical History  Diagnosis Date  . History of colon polyps   . Diverticulosis   . GERD (gastroesophageal reflux disease)   . Hyperlipidemia   . Hypertension   . CVA (cerebral vascular accident) 11/2004  . TIA (transient ischemic attack)     hospitalized 12/02, brain MRI 12/02  . MVP (mitral valve prolapse)     Echo- negative 11/04  . Osteoarthritis   . Obstruction of bowel 3/09    Laparatomy and enterolysis- Dr. Renda Rolls    Past Surgical History  Procedure Laterality Date  . Appendectomy    . Replacement total knee  1999    right  . Rotator cuff repair      right  . Cataract extraction  2000    with IOL    Family History  Problem Relation Age of Onset  . Alzheimer's disease Mother   . Diabetes Mother   . Diabetes Sister   . Coronary artery  disease Brother     CABG x 5  . Hypertension Neg Hx   . Cancer Neg Hx     no breast or colon cancer  . Diabetes Brother     History   Social History  . Marital Status: Widowed    Spouse Name: N/A    Number of Children: 2  . Years of Education: N/A   Occupational History  . housewife    Social History Main Topics  . Smoking status: Former Games developer  . Smokeless tobacco: Never Used  . Alcohol Use: Not on file  . Drug Use: No  . Sexual Activity: Not on file   Other Topics Concern  . Not on file   Social History Narrative  . No narrative on file   Review of Systems Had nasal lesion removed Due for BCC removal in 2 weeks on right lower eye lid Sleeps well Appetite is fine No heartburn or swallowing problems    Objective:   Physical Exam  Constitutional: She appears well-developed and well-nourished. No distress.  Neck: Normal range of motion. Neck supple. No thyromegaly present.  Cardiovascular: Normal rate, regular rhythm, normal heart sounds and intact distal pulses.  Exam reveals no gallop.  No murmur heard. Pulmonary/Chest: Effort normal and breath sounds normal. No respiratory distress. She has no wheezes. She has no rales.  Abdominal: Soft. There is no tenderness.  Musculoskeletal: She exhibits no edema and no tenderness.  Lymphadenopathy:    She has no cervical adenopathy.  Psychiatric: She has a normal mood and affect. Her behavior is normal.          Assessment & Plan:

## 2013-01-02 NOTE — Addendum Note (Signed)
Addended by: Sueanne Margarita on: 01/02/2013 11:48 AM   Modules accepted: Orders

## 2013-01-02 NOTE — Assessment & Plan Note (Signed)
No problems with the statin Due for labs 

## 2013-01-02 NOTE — Assessment & Plan Note (Signed)
Mild pain Discussed analgesics

## 2013-01-02 NOTE — Assessment & Plan Note (Signed)
Mild weakness and balance issues Uses the cane or walker

## 2013-01-03 ENCOUNTER — Encounter: Payer: Self-pay | Admitting: *Deleted

## 2013-01-16 DIAGNOSIS — C44111 Basal cell carcinoma of skin of unspecified eyelid, including canthus: Secondary | ICD-10-CM | POA: Diagnosis not present

## 2013-01-17 DIAGNOSIS — C44111 Basal cell carcinoma of skin of unspecified eyelid, including canthus: Secondary | ICD-10-CM | POA: Diagnosis not present

## 2013-01-17 DIAGNOSIS — H5789 Other specified disorders of eye and adnexa: Secondary | ICD-10-CM | POA: Diagnosis not present

## 2013-01-17 DIAGNOSIS — S01119A Laceration without foreign body of unspecified eyelid and periocular area, initial encounter: Secondary | ICD-10-CM | POA: Diagnosis not present

## 2013-01-17 DIAGNOSIS — S0180XA Unspecified open wound of other part of head, initial encounter: Secondary | ICD-10-CM | POA: Diagnosis not present

## 2013-01-17 DIAGNOSIS — S0120XA Unspecified open wound of nose, initial encounter: Secondary | ICD-10-CM | POA: Diagnosis not present

## 2013-01-17 DIAGNOSIS — L905 Scar conditions and fibrosis of skin: Secondary | ICD-10-CM | POA: Diagnosis not present

## 2013-05-03 ENCOUNTER — Other Ambulatory Visit: Payer: Self-pay | Admitting: Internal Medicine

## 2013-05-11 DIAGNOSIS — H02059 Trichiasis without entropian unspecified eye, unspecified eyelid: Secondary | ICD-10-CM | POA: Diagnosis not present

## 2013-07-03 ENCOUNTER — Ambulatory Visit (INDEPENDENT_AMBULATORY_CARE_PROVIDER_SITE_OTHER): Payer: Medicare Other | Admitting: Internal Medicine

## 2013-07-03 ENCOUNTER — Encounter: Payer: Self-pay | Admitting: Internal Medicine

## 2013-07-03 VITALS — BP 130/78 | HR 88 | Temp 98.1°F | Wt 144.0 lb

## 2013-07-03 DIAGNOSIS — M1990 Unspecified osteoarthritis, unspecified site: Secondary | ICD-10-CM | POA: Diagnosis not present

## 2013-07-03 DIAGNOSIS — I10 Essential (primary) hypertension: Secondary | ICD-10-CM

## 2013-07-03 DIAGNOSIS — I69998 Other sequelae following unspecified cerebrovascular disease: Secondary | ICD-10-CM | POA: Diagnosis not present

## 2013-07-03 DIAGNOSIS — E785 Hyperlipidemia, unspecified: Secondary | ICD-10-CM

## 2013-07-03 NOTE — Assessment & Plan Note (Signed)
BP Readings from Last 3 Encounters:  07/03/13 130/78  01/02/13 148/80  06/29/12 120/80   Good control No changes needed

## 2013-07-03 NOTE — Progress Notes (Signed)
Subjective:    Patient ID: Tracy Novak, female    DOB: 1920-01-03, 80 y.o.   MRN: [id number]  HPI Doing well Did have flare of nausea and cramps. Brief anorexia. Over that now  Uses cane unless she has an arm to hold onto Did have 1 fall---needed help getting up. Was trying to run to kill a bug Fortunately no injury  No chest pain May get DOE if she walks too fast---nothing concerning No ankle edema No dizziness or syncope  No muscle aching or other problems with the statin  Still with knee pain Not that bad Doesn't take anymeds  Current Outpatient Prescriptions on File Prior to Visit  Medication Sig Dispense Refill  . amLODipine (NORVASC) 10 MG tablet Take 1 tablet (10 mg total) by mouth daily.  90 tablet  3  . aspirin 81 MG tablet Take 81 mg by mouth daily.       . pravastatin (PRAVACHOL) 40 MG tablet TAKE 1 TABLET AT BEDTIME  90 tablet  3   No current facility-administered medications on file prior to visit.    Allergies  Allergen Reactions  . Cephalexin Rash    " peeled my skin off" per patient  . Hydrocodone-Acetaminophen     REACTION: nausea  . Penicillins     REACTION: unspecified  . Sulfonamide Derivatives     REACTION: unspecified    Past Medical History  Diagnosis Date  . History of colon polyps   . Diverticulosis   . GERD (gastroesophageal reflux disease)   . Hyperlipidemia   . Hypertension   . CVA (cerebral vascular accident) 11/2004  . TIA (transient ischemic attack)     hospitalized 12/02, brain MRI 12/02  . MVP (mitral valve prolapse)     Echo- negative 11/04  . Osteoarthritis   . Obstruction of bowel 3/09    Laparatomy and enterolysis- Dr. Rochel Brome    Past Surgical History  Procedure Laterality Date  . Appendectomy    . Replacement total knee  1999    right  . Rotator cuff repair      right  . Cataract extraction  2000    with IOL    Family History  Problem Relation Age of Onset  . Alzheimer's disease Mother   .  Diabetes Mother   . Diabetes Sister   . Coronary artery disease Brother     CABG x 5  . Hypertension Neg Hx   . Cancer Neg Hx     no breast or colon cancer  . Diabetes Brother     History   Social History  . Marital Status: Widowed    Spouse Name: N/A    Number of Children: 2  . Years of Education: N/A   Occupational History  . housewife    Social History Main Topics  . Smoking status: Former Research scientist (life sciences)  . Smokeless tobacco: Never Used  . Alcohol Use: Not on file  . Drug Use: No  . Sexual Activity: Not on file   Other Topics Concern  . Not on file   Social History Narrative  . No narrative on file   Review of Systems Appetite is good Weight is fairly stable    Objective:   Physical Exam  Constitutional: She appears well-developed and well-nourished. No distress.  Neck: Normal range of motion. Neck supple. No thyromegaly present.  Cardiovascular: Normal rate and regular rhythm.  Exam reveals no gallop.   Soft coarse systolic murmur at base  Pulmonary/Chest:  Effort normal and breath sounds normal. No respiratory distress. She has no wheezes. She has no rales.  Abdominal: Soft. There is no tenderness.  Musculoskeletal: She exhibits no edema.  Lymphadenopathy:    She has no cervical adenopathy.  Psychiatric: She has a normal mood and affect. Her behavior is normal.          Assessment & Plan:

## 2013-07-03 NOTE — Progress Notes (Signed)
Pre visit review using our clinic review tool, if applicable. No additional management support is needed unless otherwise documented below in the visit note. 

## 2013-07-03 NOTE — Assessment & Plan Note (Signed)
Will continue secondary prevention since she has such good functional status

## 2013-07-03 NOTE — Assessment & Plan Note (Signed)
Mild weakness and balance issues On aspirin and statin

## 2013-07-03 NOTE — Assessment & Plan Note (Signed)
Persistent pain but not bad No meds

## 2013-07-04 ENCOUNTER — Telehealth: Payer: Self-pay | Admitting: Internal Medicine

## 2013-07-04 NOTE — Telephone Encounter (Signed)
Relevant patient education mailed to patient.  

## 2013-07-20 ENCOUNTER — Telehealth: Payer: Self-pay

## 2013-07-20 NOTE — Telephone Encounter (Signed)
Order written. Please fax

## 2013-07-20 NOTE — Telephone Encounter (Signed)
Order faxed and scanned.

## 2013-07-20 NOTE — Telephone Encounter (Signed)
Merry Proud PT on campus at [facility] left v/m requesting order written for PT evaluation for pt faxed to [phone]. Pt's previous PT order has expired; pt was not ready for PT then but pt is ready to start PT now. Please advise.

## 2013-07-26 DIAGNOSIS — M25619 Stiffness of unspecified shoulder, not elsewhere classified: Secondary | ICD-10-CM | POA: Diagnosis not present

## 2013-07-26 DIAGNOSIS — R293 Abnormal posture: Secondary | ICD-10-CM | POA: Diagnosis not present

## 2013-07-26 DIAGNOSIS — R262 Difficulty in walking, not elsewhere classified: Secondary | ICD-10-CM | POA: Diagnosis not present

## 2013-07-26 DIAGNOSIS — M25569 Pain in unspecified knee: Secondary | ICD-10-CM | POA: Diagnosis not present

## 2013-08-01 DIAGNOSIS — M25619 Stiffness of unspecified shoulder, not elsewhere classified: Secondary | ICD-10-CM | POA: Diagnosis not present

## 2013-08-01 DIAGNOSIS — M25569 Pain in unspecified knee: Secondary | ICD-10-CM | POA: Diagnosis not present

## 2013-08-01 DIAGNOSIS — R293 Abnormal posture: Secondary | ICD-10-CM | POA: Diagnosis not present

## 2013-08-01 DIAGNOSIS — R262 Difficulty in walking, not elsewhere classified: Secondary | ICD-10-CM | POA: Diagnosis not present

## 2013-08-03 DIAGNOSIS — R262 Difficulty in walking, not elsewhere classified: Secondary | ICD-10-CM | POA: Diagnosis not present

## 2013-08-03 DIAGNOSIS — M25569 Pain in unspecified knee: Secondary | ICD-10-CM | POA: Diagnosis not present

## 2013-08-03 DIAGNOSIS — M25619 Stiffness of unspecified shoulder, not elsewhere classified: Secondary | ICD-10-CM | POA: Diagnosis not present

## 2013-08-03 DIAGNOSIS — R293 Abnormal posture: Secondary | ICD-10-CM | POA: Diagnosis not present

## 2013-08-08 DIAGNOSIS — R262 Difficulty in walking, not elsewhere classified: Secondary | ICD-10-CM | POA: Diagnosis not present

## 2013-08-08 DIAGNOSIS — M25569 Pain in unspecified knee: Secondary | ICD-10-CM | POA: Diagnosis not present

## 2013-08-08 DIAGNOSIS — M25619 Stiffness of unspecified shoulder, not elsewhere classified: Secondary | ICD-10-CM | POA: Diagnosis not present

## 2013-08-08 DIAGNOSIS — R293 Abnormal posture: Secondary | ICD-10-CM | POA: Diagnosis not present

## 2013-08-10 DIAGNOSIS — R262 Difficulty in walking, not elsewhere classified: Secondary | ICD-10-CM | POA: Diagnosis not present

## 2013-08-10 DIAGNOSIS — M25569 Pain in unspecified knee: Secondary | ICD-10-CM | POA: Diagnosis not present

## 2013-08-15 DIAGNOSIS — M25619 Stiffness of unspecified shoulder, not elsewhere classified: Secondary | ICD-10-CM | POA: Diagnosis not present

## 2013-08-15 DIAGNOSIS — R293 Abnormal posture: Secondary | ICD-10-CM | POA: Diagnosis not present

## 2013-08-15 DIAGNOSIS — R262 Difficulty in walking, not elsewhere classified: Secondary | ICD-10-CM | POA: Diagnosis not present

## 2013-08-15 DIAGNOSIS — M25569 Pain in unspecified knee: Secondary | ICD-10-CM | POA: Diagnosis not present

## 2013-08-17 DIAGNOSIS — R262 Difficulty in walking, not elsewhere classified: Secondary | ICD-10-CM | POA: Diagnosis not present

## 2013-08-17 DIAGNOSIS — M25569 Pain in unspecified knee: Secondary | ICD-10-CM | POA: Diagnosis not present

## 2013-08-22 DIAGNOSIS — R262 Difficulty in walking, not elsewhere classified: Secondary | ICD-10-CM | POA: Diagnosis not present

## 2013-08-22 DIAGNOSIS — M25619 Stiffness of unspecified shoulder, not elsewhere classified: Secondary | ICD-10-CM | POA: Diagnosis not present

## 2013-08-22 DIAGNOSIS — M25569 Pain in unspecified knee: Secondary | ICD-10-CM | POA: Diagnosis not present

## 2013-08-22 DIAGNOSIS — R293 Abnormal posture: Secondary | ICD-10-CM | POA: Diagnosis not present

## 2013-08-24 DIAGNOSIS — R293 Abnormal posture: Secondary | ICD-10-CM | POA: Diagnosis not present

## 2013-08-24 DIAGNOSIS — M25569 Pain in unspecified knee: Secondary | ICD-10-CM | POA: Diagnosis not present

## 2013-08-24 DIAGNOSIS — M25619 Stiffness of unspecified shoulder, not elsewhere classified: Secondary | ICD-10-CM | POA: Diagnosis not present

## 2013-08-24 DIAGNOSIS — R262 Difficulty in walking, not elsewhere classified: Secondary | ICD-10-CM | POA: Diagnosis not present

## 2013-08-31 DIAGNOSIS — R262 Difficulty in walking, not elsewhere classified: Secondary | ICD-10-CM | POA: Diagnosis not present

## 2013-08-31 DIAGNOSIS — R293 Abnormal posture: Secondary | ICD-10-CM | POA: Diagnosis not present

## 2013-08-31 DIAGNOSIS — M25619 Stiffness of unspecified shoulder, not elsewhere classified: Secondary | ICD-10-CM | POA: Diagnosis not present

## 2013-08-31 DIAGNOSIS — M25569 Pain in unspecified knee: Secondary | ICD-10-CM | POA: Diagnosis not present

## 2013-09-06 DIAGNOSIS — R262 Difficulty in walking, not elsewhere classified: Secondary | ICD-10-CM | POA: Diagnosis not present

## 2013-09-06 DIAGNOSIS — M25569 Pain in unspecified knee: Secondary | ICD-10-CM | POA: Diagnosis not present

## 2013-09-14 DIAGNOSIS — R293 Abnormal posture: Secondary | ICD-10-CM | POA: Diagnosis not present

## 2013-09-14 DIAGNOSIS — R262 Difficulty in walking, not elsewhere classified: Secondary | ICD-10-CM | POA: Diagnosis not present

## 2013-09-14 DIAGNOSIS — M25569 Pain in unspecified knee: Secondary | ICD-10-CM | POA: Diagnosis not present

## 2013-09-14 DIAGNOSIS — M25619 Stiffness of unspecified shoulder, not elsewhere classified: Secondary | ICD-10-CM | POA: Diagnosis not present

## 2013-11-03 ENCOUNTER — Other Ambulatory Visit: Payer: Self-pay | Admitting: Internal Medicine

## 2013-11-14 ENCOUNTER — Telehealth: Payer: Self-pay | Admitting: Internal Medicine

## 2013-11-14 NOTE — Telephone Encounter (Signed)
Tracy Novak dropped off handicap form to be filled out Tracy Pulse would like to pick this up tomorrow if possible On dees desk

## 2013-11-15 NOTE — Telephone Encounter (Signed)
Form done No charge 

## 2013-11-15 NOTE — Telephone Encounter (Signed)
Form on your desk  

## 2013-12-07 DIAGNOSIS — Z85828 Personal history of other malignant neoplasm of skin: Secondary | ICD-10-CM | POA: Diagnosis not present

## 2013-12-07 DIAGNOSIS — D485 Neoplasm of uncertain behavior of skin: Secondary | ICD-10-CM | POA: Diagnosis not present

## 2013-12-07 DIAGNOSIS — L738 Other specified follicular disorders: Secondary | ICD-10-CM | POA: Diagnosis not present

## 2013-12-07 DIAGNOSIS — L678 Other hair color and hair shaft abnormalities: Secondary | ICD-10-CM | POA: Diagnosis not present

## 2014-01-18 ENCOUNTER — Ambulatory Visit (INDEPENDENT_AMBULATORY_CARE_PROVIDER_SITE_OTHER): Payer: Medicare Other | Admitting: Internal Medicine

## 2014-01-18 ENCOUNTER — Encounter: Payer: Self-pay | Admitting: Internal Medicine

## 2014-01-18 VITALS — BP 130/78 | HR 79 | Temp 98.2°F | Resp 14 | Wt 143.8 lb

## 2014-01-18 DIAGNOSIS — I10 Essential (primary) hypertension: Secondary | ICD-10-CM | POA: Diagnosis not present

## 2014-01-18 DIAGNOSIS — Z0000 Encounter for general adult medical examination without abnormal findings: Secondary | ICD-10-CM | POA: Insufficient documentation

## 2014-01-18 DIAGNOSIS — M1712 Unilateral primary osteoarthritis, left knee: Secondary | ICD-10-CM | POA: Diagnosis not present

## 2014-01-18 DIAGNOSIS — E785 Hyperlipidemia, unspecified: Secondary | ICD-10-CM | POA: Diagnosis not present

## 2014-01-18 DIAGNOSIS — Z7189 Other specified counseling: Secondary | ICD-10-CM

## 2014-01-18 DIAGNOSIS — I69854 Hemiplegia and hemiparesis following other cerebrovascular disease affecting left non-dominant side: Secondary | ICD-10-CM | POA: Diagnosis not present

## 2014-01-18 DIAGNOSIS — F0150 Vascular dementia without behavioral disturbance: Secondary | ICD-10-CM | POA: Insufficient documentation

## 2014-01-18 DIAGNOSIS — Z23 Encounter for immunization: Secondary | ICD-10-CM | POA: Diagnosis not present

## 2014-01-18 DIAGNOSIS — G3184 Mild cognitive impairment, so stated: Secondary | ICD-10-CM

## 2014-01-18 DIAGNOSIS — I69354 Hemiplegia and hemiparesis following cerebral infarction affecting left non-dominant side: Secondary | ICD-10-CM

## 2014-01-18 NOTE — Assessment & Plan Note (Signed)
Mild No functional impairments with her living situation

## 2014-01-18 NOTE — Addendum Note (Signed)
Addended by: Geni Bers on: 01/18/2014 04:51 PM   Modules accepted: Orders

## 2014-01-18 NOTE — Assessment & Plan Note (Signed)
Mild and in LE Gets around with cane On ASA and statin

## 2014-01-18 NOTE — Assessment & Plan Note (Signed)
See social history 

## 2014-01-18 NOTE — Assessment & Plan Note (Signed)
Doing fine with secondary prevention

## 2014-01-18 NOTE — Progress Notes (Signed)
Subjective:    Patient ID: Tracy Novak, female    DOB: [birth year], 80 y.o.   MRN: [id number]  HPI Here for Medicare wellness and follow up Reviewed her form and advanced directives No other doctors--other than derm, eye (not recently) and dentist (Dr Lynelle Smoke) No falls this year---did have one last year No depression or anhedonia Vision and hearing are fine No apparent cognitive changes---knows her memory is not as good as in the past No tobacco or alcohol Skin cancer removed this year  Still doing well at [facility] Very happy there Son lives nearby and is very involved  Some residual left weakness--only leg. She is right handed No upper extremity problems Uses cane  No chest pain No dizziness or syncope No SOB Exercise tolerance is limited but no recent change  Ongoing left knee arthritis Doesn't feel like she wants to consider TKR --did well on the right Hasn't needed meds--but has used tylenol in the past  Continues on the statin No myalgias No GI disturbance  Current Outpatient Prescriptions on File Prior to Visit  Medication Sig Dispense Refill  . amLODipine (NORVASC) 10 MG tablet TAKE 1 TABLET EVERY DAY  90 tablet  0  . aspirin 81 MG tablet Take 81 mg by mouth daily.       . pravastatin (PRAVACHOL) 40 MG tablet TAKE 1 TABLET AT BEDTIME  90 tablet  3   No current facility-administered medications on file prior to visit.    Allergies  Allergen Reactions  . Cephalexin Rash    " peeled my skin off" per patient  . Hydrocodone-Acetaminophen     REACTION: nausea  . Penicillins     REACTION: unspecified  . Sulfonamide Derivatives     REACTION: unspecified    Past Medical History  Diagnosis Date  . History of colon polyps   . Diverticulosis   . GERD (gastroesophageal reflux disease)   . Hyperlipidemia   . Hypertension   . CVA (cerebral vascular accident) 11/2004  . TIA (transient ischemic attack)     hospitalized 12/02, brain MRI 12/02  . MVP (mitral  valve prolapse)     Echo- negative 11/04  . Osteoarthritis   . Obstruction of bowel 3/09    Laparatomy and enterolysis- Dr. Rochel Brome    Past Surgical History  Procedure Laterality Date  . Appendectomy    . Replacement total knee  1999    right  . Rotator cuff repair      right  . Cataract extraction  2000    with IOL    Family History  Problem Relation Age of Onset  . Alzheimer's disease Mother   . Diabetes Mother   . Diabetes Sister   . Coronary artery disease Brother     CABG x 5  . Hypertension Neg Hx   . Cancer Neg Hx     no breast or colon cancer  . Diabetes Brother     History   Social History  . Marital Status: Widowed    Spouse Name: N/A    Number of Children: 2  . Years of Education: N/A   Occupational History  . housewife    Social History Main Topics  . Smoking status: Former Research scientist (life sciences)  . Smokeless tobacco: Never Used  . Alcohol Use: Not on file  . Drug Use: No  . Sexual Activity: Not on file   Other Topics Concern  . Not on file   Social History Narrative   No  living will   Requests her son [name] as her health care POA   Not sure about DNR but clearly no prolonged artificial ventilation   No tube feedings if cognitively unaware   Review of Systems Appetite is good The food is okay Weight is stable Sleeps well Bowels are fine Voids okay. Some increased frequency and rare urge incontinence.    Objective:   Physical Exam  Constitutional: She is oriented to person, place, and time. She appears well-developed and well-nourished. No distress.  HENT:  Mouth/Throat: Oropharynx is clear and moist. No oropharyngeal exudate.  Neck: Normal range of motion. Neck supple. No thyromegaly present.  Cardiovascular: Normal rate, regular rhythm and normal heart sounds.  Exam reveals no gallop.   No murmur heard. Pulmonary/Chest: Effort normal and breath sounds normal. No respiratory distress. She has no wheezes. She has no rales.  Abdominal: Soft.  There is no tenderness.  Musculoskeletal: She exhibits no edema and no tenderness.  Lymphadenopathy:    She has no cervical adenopathy.  Neurological: She is alert and oriented to person, place, and time.  President-- "Obama, ?" [phone]..... Fatigued her D-[id number]-w (took 2 tries) Recall 0/3  Skin: No rash noted.  Psychiatric: She has a normal mood and affect. Her behavior is normal.          Assessment & Plan:

## 2014-01-18 NOTE — Assessment & Plan Note (Signed)
Gets along without meds

## 2014-01-18 NOTE — Progress Notes (Signed)
Pre visit review using our clinic review tool, if applicable. No additional management support is needed unless otherwise documented below in the visit note. 

## 2014-01-18 NOTE — Assessment & Plan Note (Signed)
I have personally reviewed the Medicare Annual Wellness questionnaire and have noted 1. The patient's medical and social history 2. Their use of alcohol, tobacco or illicit drugs 3. Their current medications and supplements 4. The patient's functional ability including ADL's, fall risks, home safety risks and hearing or visual             impairment. 5. Diet and physical activities 6. Evidence for depression or mood disorders  The patients weight, height, BMI and visual acuity have been recorded in the chart I have made referrals, counseling and provided education to the patient based review of the above and I have provided the pt with a written personalized care plan for preventive services.  I have provided you with a copy of your personalized plan for preventive services. Please take the time to review along with your updated medication list.  No cancer screening due to age prevnar and flu No other services needed

## 2014-01-18 NOTE — Assessment & Plan Note (Signed)
BP Readings from Last 3 Encounters:  01/18/14 130/78  07/03/13 130/78  01/02/13 148/80   Good control No changes needed

## 2014-01-19 LAB — LIPID PANEL
CHOLESTEROL: 154 mg/dL (ref 0–200)
HDL: 48 mg/dL (ref >39.00)
LDL CALC: 80 mg/dL (ref 0–99)
NonHDL: 106
TRIGLYCERIDES: 129 mg/dL (ref 0.0–149.0)
Total CHOL/HDL Ratio: 3
VLDL: 25.8 mg/dL (ref 0.0–40.0)

## 2014-01-19 LAB — CBC WITH DIFFERENTIAL/PLATELET
BASOS PCT: 0.5 % (ref 0.0–3.0)
Basophils Absolute: 0 10*3/uL (ref 0.0–0.1)
Eosinophils Absolute: 0.3 10*3/uL (ref 0.0–0.7)
Eosinophils Relative: 4.1 % (ref 0.0–5.0)
HCT: 44.7 % (ref 36.0–46.0)
Hemoglobin: 14.4 g/dL (ref 12.0–15.0)
LYMPHS PCT: 25.8 % (ref 12.0–46.0)
Lymphs Abs: 2.1 10*3/uL (ref 0.7–4.0)
MCHC: 32.2 g/dL (ref 30.0–36.0)
MCV: 89.1 fl (ref 78.0–100.0)
Monocytes Absolute: 0.4 10*3/uL (ref 0.1–1.0)
Monocytes Relative: 5.1 % (ref 3.0–12.0)
Neutro Abs: 5.2 10*3/uL (ref 1.4–7.7)
Neutrophils Relative %: 64.5 % (ref 43.0–77.0)
Platelets: 231 10*3/uL (ref 150.0–400.0)
RBC: 5.01 Mil/uL (ref 3.87–5.11)
RDW: 14.6 % (ref 11.5–15.5)
WBC: 8.1 10*3/uL (ref 4.0–10.5)

## 2014-01-19 LAB — COMPREHENSIVE METABOLIC PANEL
ALT: 13 U/L (ref 0–35)
AST: 25 U/L (ref 0–37)
Albumin: 3.7 g/dL (ref 3.5–5.2)
Alkaline Phosphatase: 74 U/L (ref 39–117)
BUN: 22 mg/dL (ref 6–23)
CALCIUM: 9.3 mg/dL (ref 8.4–10.5)
CO2: 26 meq/L (ref 19–32)
Chloride: 106 mEq/L (ref 96–112)
Creatinine, Ser: 1.2 mg/dL (ref 0.4–1.2)
GFR: 45.04 mL/min — ABNORMAL LOW (ref >60.00)
GLUCOSE: 104 mg/dL — AB (ref 70–99)
POTASSIUM: 3.9 meq/L (ref 3.5–5.1)
Sodium: 138 mEq/L (ref 135–145)
Total Bilirubin: 0.6 mg/dL (ref 0.2–1.2)
Total Protein: 7.9 g/dL (ref 6.0–8.3)

## 2014-01-19 LAB — T4, FREE: Free T4: 0.86 ng/dL (ref 0.60–1.60)

## 2014-01-22 ENCOUNTER — Encounter: Payer: Self-pay | Admitting: *Deleted

## 2014-01-31 ENCOUNTER — Other Ambulatory Visit: Payer: Self-pay | Admitting: Internal Medicine

## 2014-03-30 ENCOUNTER — Other Ambulatory Visit: Payer: Self-pay | Admitting: Internal Medicine

## 2014-06-07 DIAGNOSIS — C4441 Basal cell carcinoma of skin of scalp and neck: Secondary | ICD-10-CM | POA: Diagnosis not present

## 2014-06-07 DIAGNOSIS — L570 Actinic keratosis: Secondary | ICD-10-CM | POA: Diagnosis not present

## 2014-06-07 DIAGNOSIS — X32XXXA Exposure to sunlight, initial encounter: Secondary | ICD-10-CM | POA: Diagnosis not present

## 2014-06-07 DIAGNOSIS — Z85828 Personal history of other malignant neoplasm of skin: Secondary | ICD-10-CM | POA: Diagnosis not present

## 2014-06-07 DIAGNOSIS — D485 Neoplasm of uncertain behavior of skin: Secondary | ICD-10-CM | POA: Diagnosis not present

## 2014-06-07 DIAGNOSIS — C44612 Basal cell carcinoma of skin of right upper limb, including shoulder: Secondary | ICD-10-CM | POA: Diagnosis not present

## 2014-08-21 DIAGNOSIS — C44612 Basal cell carcinoma of skin of right upper limb, including shoulder: Secondary | ICD-10-CM | POA: Diagnosis not present

## 2014-08-21 DIAGNOSIS — L905 Scar conditions and fibrosis of skin: Secondary | ICD-10-CM | POA: Diagnosis not present

## 2014-09-04 DIAGNOSIS — C4441 Basal cell carcinoma of skin of scalp and neck: Secondary | ICD-10-CM | POA: Diagnosis not present

## 2014-09-04 DIAGNOSIS — L905 Scar conditions and fibrosis of skin: Secondary | ICD-10-CM | POA: Diagnosis not present

## 2014-11-07 DIAGNOSIS — R262 Difficulty in walking, not elsewhere classified: Secondary | ICD-10-CM | POA: Diagnosis not present

## 2014-11-07 DIAGNOSIS — M25562 Pain in left knee: Secondary | ICD-10-CM | POA: Diagnosis not present

## 2014-11-09 DIAGNOSIS — R262 Difficulty in walking, not elsewhere classified: Secondary | ICD-10-CM | POA: Diagnosis not present

## 2014-11-09 DIAGNOSIS — M25562 Pain in left knee: Secondary | ICD-10-CM | POA: Diagnosis not present

## 2014-11-15 DIAGNOSIS — M25562 Pain in left knee: Secondary | ICD-10-CM | POA: Diagnosis not present

## 2014-11-15 DIAGNOSIS — R262 Difficulty in walking, not elsewhere classified: Secondary | ICD-10-CM | POA: Diagnosis not present

## 2014-11-21 DIAGNOSIS — R262 Difficulty in walking, not elsewhere classified: Secondary | ICD-10-CM | POA: Diagnosis not present

## 2014-11-21 DIAGNOSIS — M25562 Pain in left knee: Secondary | ICD-10-CM | POA: Diagnosis not present

## 2014-11-28 ENCOUNTER — Ambulatory Visit (INDEPENDENT_AMBULATORY_CARE_PROVIDER_SITE_OTHER): Payer: Medicare Other | Admitting: Internal Medicine

## 2014-11-28 ENCOUNTER — Ambulatory Visit (INDEPENDENT_AMBULATORY_CARE_PROVIDER_SITE_OTHER)
Admission: RE | Admit: 2014-11-28 | Discharge: 2014-11-28 | Disposition: A | Discharge status: Home / Self Care | Payer: Medicare Other | Source: Ambulatory Visit | Attending: Internal Medicine | Admitting: Internal Medicine

## 2014-11-28 ENCOUNTER — Encounter: Payer: Self-pay | Admitting: Internal Medicine

## 2014-11-28 ENCOUNTER — Ambulatory Visit
Admission: RE | Admit: 2014-11-28 | Discharge: 2014-11-28 | Disposition: A | Discharge status: Home / Self Care | Payer: Medicare Other | Source: Ambulatory Visit | Attending: Internal Medicine | Admitting: Internal Medicine

## 2014-11-28 VITALS — BP 120/80 | HR 72 | Temp 97.5°F | Wt 141.0 lb

## 2014-11-28 DIAGNOSIS — M1712 Unilateral primary osteoarthritis, left knee: Secondary | ICD-10-CM

## 2014-11-28 DIAGNOSIS — M25561 Pain in right knee: Secondary | ICD-10-CM | POA: Diagnosis not present

## 2014-11-28 DIAGNOSIS — M1711 Unilateral primary osteoarthritis, right knee: Secondary | ICD-10-CM

## 2014-11-28 DIAGNOSIS — Z96651 Presence of right artificial knee joint: Secondary | ICD-10-CM | POA: Diagnosis not present

## 2014-11-28 NOTE — Assessment & Plan Note (Signed)
TKR many years ago Will recheck x-ray since more pain there too

## 2014-11-28 NOTE — Progress Notes (Signed)
   Subjective:    Patient ID: Tracy Novak, female    DOB: February 08, 1920, 81 y.o.   MRN: [id number]  HPI Here with son due to ongoing knee problems  Right knee had joint replacement Done 11 years ago Now having more pain there also  Left knee is the bad one --had been working with the therapist Mild swelling  No fevers Not sick  Current Outpatient Prescriptions on File Prior to Visit  Medication Sig Dispense Refill  . amLODipine (NORVASC) 10 MG tablet TAKE 1 TABLET EVERY DAY 90 tablet 3  . aspirin 81 MG tablet Take 81 mg by mouth daily.     . pravastatin (PRAVACHOL) 40 MG tablet TAKE 1 TABLET AT BEDTIME 90 tablet 3   No current facility-administered medications on file prior to visit.    Allergies  Allergen Reactions  . Cephalexin Rash    " peeled my skin off" per patient  . Hydrocodone-Acetaminophen     REACTION: nausea  . Penicillins     REACTION: unspecified  . Sulfonamide Derivatives     REACTION: unspecified    Past Medical History  Diagnosis Date  . History of colon polyps   . Diverticulosis   . GERD (gastroesophageal reflux disease)   . Hyperlipidemia   . Hypertension   . CVA (cerebral vascular accident) 11/2004  . TIA (transient ischemic attack)     hospitalized 12/02, brain MRI 12/02  . MVP (mitral valve prolapse)     Echo- negative 11/04  . Osteoarthritis   . Obstruction of bowel 3/09    Laparatomy and enterolysis- Dr. Rochel Brome    Past Surgical History  Procedure Laterality Date  . Appendectomy    . Replacement total knee  1999    right  . Rotator cuff repair      right  . Cataract extraction  2000    with IOL    Family History  Problem Relation Age of Onset  . Alzheimer's disease Mother   . Diabetes Mother   . Diabetes Sister   . Coronary artery disease Brother     CABG x 5  . Hypertension Neg Hx   . Cancer Neg Hx     no breast or colon cancer  . Diabetes Brother     Social History   Social History  . Marital Status: Widowed      Spouse Name: N/A  . Number of Children: 2  . Years of Education: N/A   Occupational History  . housewife    Social History Main Topics  . Smoking status: Former Research scientist (life sciences)  . Smokeless tobacco: Never Used  . Alcohol Use: Not on file  . Drug Use: No  . Sexual Activity: Not on file   Other Topics Concern  . Not on file   Social History Narrative   No living will   Requests her son Tracy Novak as her health care POA   Not sure about DNR but clearly no prolonged artificial ventilation   No tube feedings if cognitively unaware   Review of Systems Sleeps okay Appetite is good    Objective:   Physical Exam  Musculoskeletal:  Left knee is thickened without effusion No ligament instability No meniscus findings Some crepitus          Assessment & Plan:

## 2014-11-28 NOTE — Assessment & Plan Note (Signed)
Worsened lately Will hold off on cortisone given [employer] policy change Will set up with Dr Lorelei Pont to consider synvisc

## 2014-11-28 NOTE — Progress Notes (Signed)
Pre visit review using our clinic review tool, if applicable. No additional management support is needed unless otherwise documented below in the visit note. 

## 2014-12-06 DIAGNOSIS — L570 Actinic keratosis: Secondary | ICD-10-CM | POA: Diagnosis not present

## 2014-12-06 DIAGNOSIS — L821 Other seborrheic keratosis: Secondary | ICD-10-CM | POA: Diagnosis not present

## 2014-12-06 DIAGNOSIS — X32XXXA Exposure to sunlight, initial encounter: Secondary | ICD-10-CM | POA: Diagnosis not present

## 2014-12-06 DIAGNOSIS — Z85828 Personal history of other malignant neoplasm of skin: Secondary | ICD-10-CM | POA: Diagnosis not present

## 2014-12-10 ENCOUNTER — Ambulatory Visit (INDEPENDENT_AMBULATORY_CARE_PROVIDER_SITE_OTHER): Payer: Medicare Other | Admitting: Family Medicine

## 2014-12-10 ENCOUNTER — Encounter: Payer: Self-pay | Admitting: Family Medicine

## 2014-12-10 VITALS — BP 118/72 | HR 77 | Temp 98.3°F | Ht 61.0 in | Wt 142.0 lb

## 2014-12-10 DIAGNOSIS — M1712 Unilateral primary osteoarthritis, left knee: Secondary | ICD-10-CM

## 2014-12-10 MED ORDER — HYLAN G-F 20 48 MG/6ML IX SOSY
48.0000 mg | PREFILLED_SYRINGE | Freq: Once | INTRA_ARTICULAR | Status: AC
  Administered 2014-12-10: 48 mg via INTRA_ARTICULAR

## 2014-12-10 NOTE — Progress Notes (Signed)
Dr. Frederico Hamman T. Kaytie Ratcliffe, MD, [facility] [place], [address] Phone: [phone] Fax: [phone]  12/10/2014  Patient: Tracy Novak, MRN: [id number], DOB: 1919/12/26, 81 y.o.  Primary Physician:  Viviana Simpler, MD  Chief Complaint: Knee Pain  Subjective:   Tracy Novak is a 81 y.o. very pleasant female patient who presents with the following:  S/p R TKA  L knee OA pain.  Synvisc. Films all reviewed.   Past Medical History, Surgical History, Social History, Family History, Problem List, Medications, and Allergies have been reviewed and updated if relevant.  Patient Active Problem List   Diagnosis Date Noted  . Osteoarthritis of right knee 11/28/2014  . Visit for preventive health examination 01/18/2014  . Advanced directives, counseling/discussion 01/18/2014  . Mild cognitive impairment 01/18/2014  . Hemiparesis affecting left side as late effect of cerebrovascular accident 12/17/2009  . CONSTIPATION 06/26/2008  . Osteoarthritis of left knee 03/30/2008  . Hyperlipemia 11/10/2006  . Essential hypertension, benign 11/10/2006  . INTERNAL HEMORRHOIDS 11/10/2006  . GERD 11/10/2006  . COLONIC POLYPS, HX OF 11/10/2006    Past Medical History  Diagnosis Date  . History of colon polyps   . Diverticulosis   . GERD (gastroesophageal reflux disease)   . Hyperlipidemia   . Hypertension   . CVA (cerebral vascular accident) 11/2004  . TIA (transient ischemic attack)     hospitalized 12/02, brain MRI 12/02  . MVP (mitral valve prolapse)     Echo- negative 11/04  . Osteoarthritis   . Obstruction of bowel 3/09    Laparatomy and enterolysis- Dr. Rochel Brome    Past Surgical History  Procedure Laterality Date  . Appendectomy    . Replacement total knee  1999    right  . Rotator cuff repair      right  . Cataract extraction  2000    with IOL    Social History   Social History  . Marital Status: Widowed   Spouse Name: N/A  . Number of Children: 2  . Years of Education: N/A   Occupational History  . housewife    Social History Main Topics  . Smoking status: Former Research scientist (life sciences)  . Smokeless tobacco: Never Used  . Alcohol Use: Not on file  . Drug Use: No  . Sexual Activity: Not on file   Other Topics Concern  . Not on file   Social History Narrative   No living will   Requests her son [name] as her health care POA   Not sure about DNR but clearly no prolonged artificial ventilation   No tube feedings if cognitively unaware    Family History  Problem Relation Age of Onset  . Alzheimer's disease Mother   . Diabetes Mother   . Diabetes Sister   . Coronary artery disease Brother     CABG x 5  . Hypertension Neg Hx   . Cancer Neg Hx     no breast or colon cancer  . Diabetes Brother     Allergies  Allergen Reactions  . Cephalexin Rash    " peeled my skin off" per patient  . Hydrocodone-Acetaminophen     REACTION: nausea  . Penicillins     REACTION: unspecified  . Sulfonamide Derivatives     REACTION: unspecified    Medication list reviewed and updated in full in [place].  GEN: No fevers, chills. Nontoxic. Primarily MSK c/o today. MSK: Detailed in  the HPI GI: tolerating PO intake without difficulty Neuro: No numbness, parasthesias, or tingling associated. Otherwise the pertinent positives of the ROS are noted above.   Objective:   BP 118/72 mmHg  Pulse 77  Temp(Src) 98.3 F (36.8 C) (Oral)  Ht 5\' 1"  (1.549 m)  Wt 142 lb (64.411 kg)  BMI 26.84 kg/m2   GEN: WDWN, NAD, Non-toxic, Alert & Oriented x 3 HEENT: Atraumatic, Normocephalic.  Ears and Nose: No external deformity. EXTR: No clubbing/cyanosis/edema NEURO: Normal gait.  PSYCH: Normally interactive. Conversant. Not depressed or anxious appearing.  Calm demeanor.   Knee:  L Gait: Normal heel toe pattern ROM: lack of 2 deg extension, flexion to 115 Effusion: neg Echymosis or edema: none Patellar  tendon NT Painful PLICA: neg Patellar grind: negative Medial and lateral patellar facet loading: negative medial and lateral joint lines: mild ttp Mcmurray's neg Flexion-pinch neg Varus and valgus stress: stable Lachman: neg Ant and Post drawer: neg Hip abduction, IR, ER: WNL Hip flexion str: 5/5 Hip abd: 5/5 Quad: 4/5 VMO atrophy: mild Hamstring concentric and eccentric: 5/5   Radiology: Dg Knee Complete 4 Views Left  11/28/2014   CLINICAL DATA:  Left knee pain 3 years with increased swelling over the past 2-3 weeks, no injury.  EXAM: LEFT KNEE - COMPLETE 4+ VIEW  COMPARISON:  None.  FINDINGS: Tricompartment osteophytosis and subchondral sclerosis on all 3 compartments. Medial and lateral joint spaces appear grossly maintained. No joint effusion.  IMPRESSION: Tricompartment osteoarthritis.   Electronically Signed   By: Lorin Picket M.D.   On: 11/28/2014 15:33   Dg Knee Complete 4 Views Right  11/28/2014   CLINICAL DATA:  Right knee pain for several years, no acute injury  EXAM: RIGHT KNEE - COMPLETE 4+ VIEW  COMPARISON:  None.  FINDINGS: The femoral and tibial components of the right total knee replacement are in normal position and alignment. No complicating features are noted. No joint effusion is seen.  IMPRESSION: Right total knee replaced components in good position. No complicating features   Electronically Signed   By: Ivar Drape M.D.   On: 11/28/2014 15:33   Assessment and Plan:   Primary osteoarthritis of left knee  Knee Injection: Synvisc-One, LEFT Patient verbally consented to procedure. Risks (including infection), benefits, and alternatives explained. Sterilely prepped with Chloraprep. Ethyl cholride used for anesthesia, then 7 cc of Lidocaine 1% used for anesthesia in the anterolateral position. Reprepped with Chloraprep.  Anterolateral approach used to inject joint without difficulty, injected with Synvisc-One, 6 mL. No complications with procedure and tolerated well.    Procedure only  Follow-up: Return if symptoms worsen or fail to improve.  Signed,  Maud Deed. Alyus Mofield, MD   Patient's Medications  New Prescriptions   No medications on file  Previous Medications   AMLODIPINE (NORVASC) 10 MG TABLET    TAKE 1 TABLET EVERY DAY   ASPIRIN 81 MG TABLET    Take 81 mg by mouth daily.    PRAVASTATIN (PRAVACHOL) 40 MG TABLET    TAKE 1 TABLET AT BEDTIME  Modified Medications   No medications on file  Discontinued Medications   No medications on file

## 2014-12-10 NOTE — Progress Notes (Signed)
Pre visit review using our clinic review tool, if applicable. No additional management support is needed unless otherwise documented below in the visit note. 

## 2015-01-21 ENCOUNTER — Encounter: Payer: Self-pay | Admitting: Internal Medicine

## 2015-01-21 ENCOUNTER — Ambulatory Visit (INDEPENDENT_AMBULATORY_CARE_PROVIDER_SITE_OTHER): Payer: Medicare Other | Admitting: Internal Medicine

## 2015-01-21 VITALS — BP 128/80 | HR 92 | Temp 98.0°F | Ht 61.0 in | Wt 142.0 lb

## 2015-01-21 DIAGNOSIS — I69354 Hemiplegia and hemiparesis following cerebral infarction affecting left non-dominant side: Secondary | ICD-10-CM

## 2015-01-21 DIAGNOSIS — E785 Hyperlipidemia, unspecified: Secondary | ICD-10-CM

## 2015-01-21 DIAGNOSIS — Z0000 Encounter for general adult medical examination without abnormal findings: Secondary | ICD-10-CM

## 2015-01-21 DIAGNOSIS — Z23 Encounter for immunization: Secondary | ICD-10-CM

## 2015-01-21 DIAGNOSIS — G3184 Mild cognitive impairment, so stated: Secondary | ICD-10-CM

## 2015-01-21 DIAGNOSIS — I10 Essential (primary) hypertension: Secondary | ICD-10-CM | POA: Diagnosis not present

## 2015-01-21 DIAGNOSIS — Z7189 Other specified counseling: Secondary | ICD-10-CM

## 2015-01-21 LAB — COMPREHENSIVE METABOLIC PANEL
ALT: 15 U/L (ref 0–35)
AST: 23 U/L (ref 0–37)
Albumin: 4.4 g/dL (ref 3.5–5.2)
Alkaline Phosphatase: 68 U/L (ref 39–117)
BILIRUBIN TOTAL: 0.5 mg/dL (ref 0.2–1.2)
BUN: 29 mg/dL — ABNORMAL HIGH (ref 6–23)
CALCIUM: 9.7 mg/dL (ref 8.4–10.5)
CHLORIDE: 107 meq/L (ref 96–112)
CO2: 28 meq/L (ref 19–32)
CREATININE: 0.97 mg/dL (ref 0.40–1.20)
GFR: 56.9 mL/min — ABNORMAL LOW (ref >60.00)
GLUCOSE: 96 mg/dL (ref 70–99)
Potassium: 4 mEq/L (ref 3.5–5.1)
Sodium: 142 mEq/L (ref 135–145)
Total Protein: 7.6 g/dL (ref 6.0–8.3)

## 2015-01-21 LAB — LIPID PANEL
CHOL/HDL RATIO: 3
Cholesterol: 163 mg/dL (ref 0–200)
HDL: 54.7 mg/dL (ref >39.00)
LDL CALC: 84 mg/dL (ref 0–99)
NONHDL: 108.26
TRIGLYCERIDES: 120 mg/dL (ref 0.0–149.0)
VLDL: 24 mg/dL (ref 0.0–40.0)

## 2015-01-21 LAB — CBC WITH DIFFERENTIAL/PLATELET
Basophils Absolute: 0 10*3/uL (ref 0.0–0.1)
Basophils Relative: 0.3 % (ref 0.0–3.0)
EOS PCT: 2.2 % (ref 0.0–5.0)
Eosinophils Absolute: 0.2 10*3/uL (ref 0.0–0.7)
HEMATOCRIT: 45 % (ref 36.0–46.0)
Hemoglobin: 14.8 g/dL (ref 12.0–15.0)
LYMPHS ABS: 1.9 10*3/uL (ref 0.7–4.0)
LYMPHS PCT: 22.6 % (ref 12.0–46.0)
MCHC: 32.9 g/dL (ref 30.0–36.0)
MCV: 90.7 fl (ref 78.0–100.0)
MONOS PCT: 9.3 % (ref 3.0–12.0)
Monocytes Absolute: 0.8 10*3/uL (ref 0.1–1.0)
NEUTROS PCT: 65.6 % (ref 43.0–77.0)
Neutro Abs: 5.4 10*3/uL (ref 1.4–7.7)
Platelets: 224 10*3/uL (ref 150.0–400.0)
RBC: 4.96 Mil/uL (ref 3.87–5.11)
RDW: 14.8 % (ref 11.5–15.5)
WBC: 8.3 10*3/uL (ref 4.0–10.5)

## 2015-01-21 LAB — T4, FREE: Free T4: 0.89 ng/dL (ref 0.60–1.60)

## 2015-01-21 NOTE — Assessment & Plan Note (Signed)
BP Readings from Last 3 Encounters:  01/21/15 128/80  12/10/14 118/72  11/28/14 120/80   Good control

## 2015-01-21 NOTE — Progress Notes (Signed)
Subjective:    Patient ID: Tracy Novak, female    DOB: April 11, 1920, 81 y.o.   MRN: [id number]  HPI Here for Medicare wellness visit and follow up of chronmic medical problems Reviewed advanced directives Son is here No hospitalization or procedures in past year Dr Lynelle Smoke for dentist, [place] derm--- no others No alcohol or tobacco She is not doing any set exercise Independent with ADLs and some instrumental ADLs--doesn't drive, once a week housekeeping. She does laundry Some cognitive problems No falls No depression but melancholy at times. Misses siblings (she is the last). Lost SIL this year. Not anhedonic  Left knee is still painful synvisc no help Uses tylenol arthritis regularly--not clearly helpful though (but not consistent with them) Walks around the grounds at [place] okay No problems in her apartment Uses rollator to get around  Still slightly weakness in left leg No new symptoms--no facial droop, new weakness, aphasia, dysphagia Son notes more cognitive changes-- trouble with word finding Bills are on autopay--but she keeps up with finances  No chest pain No SOB No dizziness or syncope No edema  Current Outpatient Prescriptions on File Prior to Visit  Medication Sig Dispense Refill  . amLODipine (NORVASC) 10 MG tablet TAKE 1 TABLET EVERY DAY 90 tablet 3  . aspirin 81 MG tablet Take 81 mg by mouth daily.     . pravastatin (PRAVACHOL) 40 MG tablet TAKE 1 TABLET AT BEDTIME 90 tablet 3   No current facility-administered medications on file prior to visit.    Allergies  Allergen Reactions  . Cephalexin Rash    " peeled my skin off" per patient  . Hydrocodone-Acetaminophen     REACTION: nausea  . Penicillins     REACTION: unspecified  . Sulfonamide Derivatives     REACTION: unspecified    Past Medical History  Diagnosis Date  . History of colon polyps   . Diverticulosis   . GERD (gastroesophageal reflux disease)   . Hyperlipidemia   . Hypertension     . CVA (cerebral vascular accident) ([place]) 11/2004  . TIA (transient ischemic attack)     hospitalized 12/02, brain MRI 12/02  . MVP (mitral valve prolapse)     Echo- negative 11/04  . Osteoarthritis   . Obstruction of bowel ([place]) 3/09    Laparatomy and enterolysis- Dr. Rochel Brome    Past Surgical History  Procedure Laterality Date  . Appendectomy    . Replacement total knee  1999    right  . Rotator cuff repair      right  . Cataract extraction  2000    with IOL    Family History  Problem Relation Age of Onset  . Alzheimer's disease Mother   . Diabetes Mother   . Diabetes Sister   . Coronary artery disease Brother     CABG x 5  . Hypertension Neg Hx   . Cancer Neg Hx     no breast or colon cancer  . Diabetes Brother     Social History   Social History  . Marital Status: Widowed    Spouse Name: N/A  . Number of Children: 2  . Years of Education: N/A   Occupational History  . housewife    Social History Main Topics  . Smoking status: Former Research scientist (life sciences)  . Smokeless tobacco: Never Used  . Alcohol Use: Not on file  . Drug Use: No  . Sexual Activity: Not on file   Other Topics Concern  . Not  on file   Social History Narrative   No living will   Requests her son Tracy Novak as her health care POA   Requests DNR---done 01/21/15   No tube feedings if cognitively unaware   Review of Systems No heartburn Wears seat belt Teeth okay--regular with dentist No skin lesions--- goes to [facility]. Did have some lesions treated recently (past BCC) Bowels are okay Voids fine--- has some urgency. Now with nocturia but no incontinence    Objective:   Physical Exam  Constitutional: She is oriented to person, place, and time. She appears well-developed and well-nourished. No distress.  Neck: Normal range of motion. Neck supple. No thyromegaly present.  Cardiovascular: Normal rate, regular rhythm, normal heart sounds and intact distal pulses.  Exam reveals no  gallop.   No murmur heard. Pulmonary/Chest: Effort normal and breath sounds normal. No respiratory distress. She has no wheezes. She has no rales.  Abdominal: Soft. There is no tenderness.  Musculoskeletal: She exhibits no edema or tenderness.  Lymphadenopathy:    She has no cervical adenopathy.  Neurological: She is alert and oriented to person, place, and time.  President-- "Obama, ?" [phone], no 6.... D-l-o-r Recall 1/3  Skin: No rash noted.  Psychiatric: She has a normal mood and affect. Her behavior is normal.          Assessment & Plan:

## 2015-01-21 NOTE — Assessment & Plan Note (Signed)
No problems with statin 

## 2015-01-21 NOTE — Assessment & Plan Note (Signed)
Mild progression but no true functional decline

## 2015-01-21 NOTE — Assessment & Plan Note (Signed)
DNR done today 

## 2015-01-21 NOTE — Assessment & Plan Note (Signed)
Mild leg leg weakness persists Gets along with rollator ASA, statin

## 2015-01-21 NOTE — Progress Notes (Signed)
Pre visit review using our clinic review tool, if applicable. No additional management support is needed unless otherwise documented below in the visit note. 

## 2015-01-21 NOTE — Addendum Note (Signed)
Addended by: Despina Hidden on: 01/21/2015 01:02 PM   Modules accepted: Orders

## 2015-01-21 NOTE — Assessment & Plan Note (Signed)
I have personally reviewed the Medicare Annual Wellness questionnaire and have noted 1. The patient's medical and social history 2. Their use of alcohol, tobacco or illicit drugs 3. Their current medications and supplements 4. The patient's functional ability including ADL's, fall risks, home safety risks and hearing or visual             impairment. 5. Diet and physical activities 6. Evidence for depression or mood disorders  The patients weight, height, BMI and visual acuity have been recorded in the chart I have made referrals, counseling and provided education to the patient based review of the above and I have provided the pt with a written personalized care plan for preventive services.  I have provided you with a copy of your personalized plan for preventive services. Please take the time to review along with your updated medication list.  Flu vaccine No cancer screening due to age

## 2015-01-22 ENCOUNTER — Encounter: Payer: Self-pay | Admitting: *Deleted

## 2015-04-11 ENCOUNTER — Encounter: Payer: Self-pay | Admitting: *Deleted

## 2015-05-20 ENCOUNTER — Other Ambulatory Visit: Payer: Self-pay | Admitting: Internal Medicine

## 2015-06-24 ENCOUNTER — Telehealth: Payer: Self-pay | Admitting: Internal Medicine

## 2015-06-24 NOTE — Telephone Encounter (Signed)
Pt daughter in law dropped off handicap form to be completed. Juliann Pulse stated he other card was left in a rental car. Please call Juliann Pulse to pick up completed forms   Forms placed on [place] desk.

## 2015-06-25 NOTE — Telephone Encounter (Signed)
Form in Dr Alla German inbox to sign upon his return

## 2015-06-26 NOTE — Telephone Encounter (Signed)
Please have someone else sign for me She should have handicapped plates (needs walker, etc)

## 2015-06-27 NOTE — Telephone Encounter (Signed)
Spoke to patient. Tried to call daughter-in-law but voicemaill is full. Forms up front ready to pickup

## 2015-06-28 DIAGNOSIS — J209 Acute bronchitis, unspecified: Secondary | ICD-10-CM | POA: Diagnosis not present

## 2015-08-01 DIAGNOSIS — Z08 Encounter for follow-up examination after completed treatment for malignant neoplasm: Secondary | ICD-10-CM | POA: Diagnosis not present

## 2015-08-01 DIAGNOSIS — L821 Other seborrheic keratosis: Secondary | ICD-10-CM | POA: Diagnosis not present

## 2015-08-01 DIAGNOSIS — Z85828 Personal history of other malignant neoplasm of skin: Secondary | ICD-10-CM | POA: Diagnosis not present

## 2015-12-12 ENCOUNTER — Telehealth: Payer: Self-pay | Admitting: Internal Medicine

## 2015-12-12 NOTE — Telephone Encounter (Signed)
Pt son called stating that pt could be worked in tomorrow @ 4:30pm. I check pt chart no note to support what he was saying. Please advise? Thank you!

## 2015-12-12 NOTE — Telephone Encounter (Signed)
Yes, she is a friend of family and the request was approved by me.  Couldn't find her in [employer] after 10 minutes so I told them to call the office.  Please add to schedule for tomorrow.

## 2015-12-12 NOTE — Telephone Encounter (Signed)
Memory problems, fell this morning, didn't remember anything after the fall.  Son or another family member will come with her. Thanks She will come at 1130am. thanks

## 2015-12-13 ENCOUNTER — Ambulatory Visit (INDEPENDENT_AMBULATORY_CARE_PROVIDER_SITE_OTHER): Payer: Medicare Other | Admitting: Internal Medicine

## 2015-12-13 VITALS — BP 112/70 | HR 114 | Temp 98.0°F | Resp 12 | Ht 61.0 in | Wt 130.2 lb

## 2015-12-13 DIAGNOSIS — R0789 Other chest pain: Secondary | ICD-10-CM | POA: Diagnosis not present

## 2015-12-13 DIAGNOSIS — N3941 Urge incontinence: Secondary | ICD-10-CM | POA: Diagnosis not present

## 2015-12-13 DIAGNOSIS — Z9181 History of falling: Secondary | ICD-10-CM

## 2015-12-13 DIAGNOSIS — Z23 Encounter for immunization: Secondary | ICD-10-CM | POA: Diagnosis not present

## 2015-12-13 DIAGNOSIS — R404 Transient alteration of awareness: Secondary | ICD-10-CM

## 2015-12-13 DIAGNOSIS — R002 Palpitations: Secondary | ICD-10-CM

## 2015-12-13 DIAGNOSIS — G3184 Mild cognitive impairment, so stated: Secondary | ICD-10-CM

## 2015-12-13 DIAGNOSIS — R000 Tachycardia, unspecified: Secondary | ICD-10-CM

## 2015-12-13 LAB — URINALYSIS, ROUTINE W REFLEX MICROSCOPIC
KETONES UR: 15 — AB
NITRITE: NEGATIVE
PH: 5.5 (ref 5.0–8.0)
SPECIFIC GRAVITY, URINE: 1.02 (ref 1.000–1.030)
Urine Glucose: NEGATIVE
Urobilinogen, UA: 0.2 (ref 0.0–1.0)

## 2015-12-13 LAB — COMPREHENSIVE METABOLIC PANEL
ALT: 18 U/L (ref 0–35)
AST: 39 U/L — ABNORMAL HIGH (ref 0–37)
Albumin: 4.3 g/dL (ref 3.5–5.2)
Alkaline Phosphatase: 69 U/L (ref 39–117)
BILIRUBIN TOTAL: 0.9 mg/dL (ref 0.2–1.2)
BUN: 32 mg/dL — AB (ref 6–23)
CO2: 25 meq/L (ref 19–32)
Calcium: 9.3 mg/dL (ref 8.4–10.5)
Chloride: 104 mEq/L (ref 96–112)
Creatinine, Ser: 1.46 mg/dL — ABNORMAL HIGH (ref 0.40–1.20)
GFR: 35.42 mL/min — AB (ref >60.00)
GLUCOSE: 105 mg/dL — AB (ref 70–99)
Potassium: 3.7 mEq/L (ref 3.5–5.1)
SODIUM: 140 meq/L (ref 135–145)
Total Protein: 7.1 g/dL (ref 6.0–8.3)

## 2015-12-13 LAB — POCT URINALYSIS DIPSTICK
Blood, UA: NEGATIVE
Glucose, UA: NEGATIVE
Ketones, UA: 15
NITRITE UA: NEGATIVE
PH UA: 5.5
PROTEIN UA: 30
Spec Grav, UA: 1.025
UROBILINOGEN UA: 0.2

## 2015-12-13 LAB — TROPONIN I: TNIDX: 0.04 ug/l (ref 0.00–0.06)

## 2015-12-13 LAB — TSH: TSH: 2.27 u[IU]/mL (ref 0.35–4.50)

## 2015-12-13 NOTE — Progress Notes (Signed)
Pre-visit discussion using our clinic review tool. No additional management support is needed unless otherwise documented below in the visit note.ekg

## 2015-12-13 NOTE — Progress Notes (Signed)
Subjective:  Patient ID: Tracy Novak, female    DOB: [birth year]  Age: 82 y.o. MRN: [id number]  CC: The primary encounter diagnosis was Palpitations. Diagnoses of Encounter for immunization, Transient alteration of awareness, Urge incontinence of urine, Other chest pain, History of fall within past 90 days, Tachycardia, Mild cognitive impairment, and Urge urinary incontinence were also pertinent to this visit.  HPI Tracy Novak presents for evaluation after a fall yesterday morning.   Brought in by Tracy Novak ,  Other son Tracy Novak (lives in [place]), evaluation was requested by Juliann Pulse  Her nniece.  Family found patient fully dressed, front door unlocked ,  tlying on the bathroom floor,  had been incontinent of bowels and bladder,  Was confused , speech was inarticulate, but face was working  and arms/legs were all working.  Speech was transiently slurred for about 10 seconds.     The house was uncharacteristically messy,  Every room was  a mess,however patient had no memory of doing this or of falling. No signs of forced entry and nothing was stolen.  She has had trouble with urinary incontinent since then.   Last seen by PCP October 2016 for wellness,  Some cognitive decline noted then   Weight loss of 12 lbs since then , tachycardic today.  Living arrangements - the patient lives at [facility] in an independent apartment at "[employer]" which  provides noon meal at the dinig hall and a take home salad from the salad bar for dinner,  Family takes her dinner 4 to 5 times per week .  Son throws this salad away several times per week   Doesn't read anymore due to short attention span.  Watches TV ok.  In  bed   History of remote CVA 12 years ago  with hemiparesis affecting the left side, rehab at [facility],   Was relocated to the [place] at [facility].   Using a rollator walker,  On asa and statin .    Outpatient Medications Prior to Visit  Medication Sig Dispense Refill  . amLODipine (NORVASC) 10 MG tablet  TAKE 1 TABLET EVERY DAY 90 tablet 3  . aspirin 81 MG tablet Take 81 mg by mouth daily.     . pravastatin (PRAVACHOL) 40 MG tablet TAKE 1 TABLET AT BEDTIME 90 tablet 3   No facility-administered medications prior to visit.     Review of Systems;  Patient denies headache, fevers, malaise, unintentional weight loss, skin rash, eye pain, sinus congestion and sinus pain, sore throat, dysphagia,  hemoptysis , cough, dyspnea, wheezing, chest pain, palpitations, orthopnea, edema, abdominal pain, nausea, melena, diarrhea, constipation, flank pain, dysuria, hematuria, urinary  Frequency, nocturia, numbness, tingling, seizures,  Focal weakness, Loss of consciousness,  Tremor, insomnia, depression, anxiety, and suicidal ideation.      Objective:  BP 112/70   Pulse (!) 114   Temp 98 F (36.7 C) (Oral)   Resp 12   Ht _0  (1.549 m)   Wt 130 lb 4 oz (59.1 kg)   SpO2 97%   BMI 24.61 kg/m   BP Readings from Last 3 Encounters:  12/13/15 112/70  01/21/15 128/80  12/10/14 118/72    Wt Readings from Last 3 Encounters:  12/13/15 130 lb 4 oz (59.1 kg)  01/21/15 142 lb (64.4 kg)  12/10/14 142 lb (64.4 kg)    General appearance: alert, cooperative and appears stated age Ears: normal TM's and external ear canals both ears Throat: lips, mucosa, and  tongue normal; teeth and gums normal Neck: no adenopathy, no carotid bruit, supple, symmetrical, trachea midline and thyroid not enlarged, symmetric, no tenderness/mass/nodules Back: symmetric, no curvature. ROM normal. No CVA tenderness. Lungs: clear to auscultation bilaterally Heart: regular rate and rhythm, S1, S2 normal, no murmur, click, rub or gallop Abdomen: soft, non-tender; bowel sounds normal; no masses,  no organomegaly Pulses: 2+ and symmetric Skin: Skin color, texture, turgor normal. No rashes or lesions Lymph nodes: Cervical, supraclavicular, and axillary nodes normal.  No results found for: HGBA1C  Lab Results  Component Value Date    CREATININE 1.46 (H) 12/13/2015   CREATININE 0.97 01/21/2015   CREATININE 1.2 01/18/2014    Lab Results  Component Value Date   WBC 8.3 01/21/2015   HGB 14.8 01/21/2015   HCT 45.0 01/21/2015   PLT 224.0 01/21/2015   GLUCOSE 105 (H) 12/13/2015   CHOL 163 01/21/2015   TRIG 120.0 01/21/2015   HDL 54.70 01/21/2015   LDLCALC 84 01/21/2015   ALT 18 12/13/2015   AST 39 (H) 12/13/2015   NA 140 12/13/2015   K 3.7 12/13/2015   CL 104 12/13/2015   CREATININE 1.46 (H) 12/13/2015   BUN 32 (H) 12/13/2015   CO2 25 12/13/2015   TSH 2.27 12/13/2015    Dg Knee Complete 4 Views Left  Result Date: 11/28/2014 CLINICAL DATA:  Left knee pain 3 years with increased swelling over the past 2-3 weeks, no injury. EXAM: LEFT KNEE - COMPLETE 4+ VIEW COMPARISON:  None. FINDINGS: Tricompartment osteophytosis and subchondral sclerosis on all 3 compartments. Medial and lateral joint spaces appear grossly maintained. No joint effusion. IMPRESSION: Tricompartment osteoarthritis. Electronically Signed   By: Lorin Picket M.D.   On: 11/28/2014 15:33   Dg Knee Complete 4 Views Right  Result Date: 11/28/2014 CLINICAL DATA:  Right knee pain for several years, no acute injury EXAM: RIGHT KNEE - COMPLETE 4+ VIEW COMPARISON:  None. FINDINGS: The femoral and tibial components of the right total knee replacement are in normal position and alignment. No complicating features are noted. No joint effusion is seen. IMPRESSION: Right total knee replaced components in good position. No complicating features Electronically Signed   By: Ivar Drape M.D.   On: 11/28/2014 15:33    Assessment & Plan:   Problem List Items Addressed This Visit    Mild cognitive impairment    Progressive over the last year.  Liley vascular dementia given history of CVA       History of fall within past 90 days    Unclear if patient fell due to a TIA or seizure.  She has no signs of UTI by today's labs,  But does appear dehydrated and has lost  weight since her last visit.  Family is very concerned despite her age and requesting definitive evaluation . MIR of brain to be ordered, followed by Neurology consult.       Tachycardia    Secondary to dehydration with acute renal failure noted on labs.  Advised to increase her water intake.       Urge urinary incontinence    No signs of UTI>  Likely due to CVA /dementia       Other Visit Diagnoses    Palpitations    -  Primary   Relevant Orders   EKG 12-Lead   Comp Met (CMET) (Completed)   Encounter for immunization       Relevant Orders   Flu vaccine HIGH DOSE PF (Completed)   EKG 12-Lead  POCT urinalysis dipstick (Completed)   Urinalysis, Routine w reflex microscopic (Completed)   Urine culture (Completed)   CK Total (and CKMB) (Completed)   Comp Met (CMET) (Completed)   TSH (Completed)   Troponin I (Completed)   Transient alteration of awareness       Relevant Orders   TSH (Completed)   MR Brain Wo Contrast   Urge incontinence of urine       Relevant Orders   POCT urinalysis dipstick (Completed)   Urinalysis, Routine w reflex microscopic (Completed)   Urine culture (Completed)   Other chest pain       Relevant Orders   CK Total (and CKMB) (Completed)   Troponin I (Completed)    A total of 60 minutes was spent with patient and son , more than half of which was spent in counseling patient on the above mentioned issues , reviewing and explaining recent labs and imaging studies done, and coordination of care.   I am having Ms. Coghlan maintain her aspirin, pravastatin, and amLODipine.  No orders of the defined types were placed in this encounter.   There are no discontinued medications.  Follow-up: No Follow-up on file.   Crecencio Mc, MD

## 2015-12-13 NOTE — Telephone Encounter (Signed)
Ok. Thank you.

## 2015-12-13 NOTE — Patient Instructions (Signed)
Your recent fall accompanied by confusion may be due to many possible causes  We are ruling out a urinary tract infection first  If there is no UTI,  I will  order a Neurology evaluation to rule out seizure as a cause  This will require an MRI of your brain first

## 2015-12-14 ENCOUNTER — Telehealth: Payer: Self-pay | Admitting: Family Medicine

## 2015-12-14 LAB — CK TOTAL AND CKMB (NOT AT ARMC)
CK TOTAL: 580 U/L — AB (ref 7–177)
CK, MB: 7.2 ng/mL — AB (ref 0.0–5.0)
Relative Index: 1.2 (ref 0.0–4.0)

## 2015-12-14 NOTE — Telephone Encounter (Signed)
Received call from team health. Patient with elevated creatinine and elevated CK.  Cannot see note other than AVS instructions. Alludes to confusion. Advised to go to ED.

## 2015-12-14 NOTE — Telephone Encounter (Signed)
Received call back from RN.  Connected to patient's son.  Patient is doing well and has a normal mental status this time. Son reluctant to take her to the emergency room. I advised him that I was making a judgment call based on her labs and age and what information I could gather from the chart. Advised hydration and close follow-up. Patient's son to contact PCP early next week.

## 2015-12-15 DIAGNOSIS — R000 Tachycardia, unspecified: Secondary | ICD-10-CM | POA: Insufficient documentation

## 2015-12-15 DIAGNOSIS — N3941 Urge incontinence: Secondary | ICD-10-CM | POA: Insufficient documentation

## 2015-12-15 DIAGNOSIS — Z9181 History of falling: Secondary | ICD-10-CM | POA: Insufficient documentation

## 2015-12-15 LAB — URINE CULTURE: Organism ID, Bacteria: 10000

## 2015-12-15 NOTE — Assessment & Plan Note (Signed)
Progressive over the last year.  Liley vascular dementia given history of CVA

## 2015-12-15 NOTE — Assessment & Plan Note (Signed)
Secondary to dehydration with acute renal failure noted on labs.  Advised to increase her water intake.

## 2015-12-15 NOTE — Assessment & Plan Note (Signed)
No signs of UTI>  Likely due to CVA [place]

## 2015-12-15 NOTE — Assessment & Plan Note (Signed)
Unclear if patient fell due to a TIA or seizure.  She has no signs of UTI by today's labs,  But does appear dehydrated and has lost weight since her last visit.  Family is very concerned despite her age and requesting definitive evaluation . MIR of brain to be ordered, followed by Neurology consult.

## 2015-12-27 ENCOUNTER — Ambulatory Visit: Payer: BLUE CROSS/BLUE SHIELD

## 2016-01-02 ENCOUNTER — Ambulatory Visit
Admission: RE | Admit: 2016-01-02 | Discharge: 2016-01-02 | Disposition: A | Discharge status: Home / Self Care | Payer: Medicare Other | Source: Ambulatory Visit | Attending: Internal Medicine | Admitting: Internal Medicine

## 2016-01-02 DIAGNOSIS — R404 Transient alteration of awareness: Secondary | ICD-10-CM | POA: Diagnosis not present

## 2016-01-02 DIAGNOSIS — I6782 Cerebral ischemia: Secondary | ICD-10-CM | POA: Insufficient documentation

## 2016-01-02 DIAGNOSIS — S0990XA Unspecified injury of head, initial encounter: Secondary | ICD-10-CM | POA: Diagnosis not present

## 2016-01-02 DIAGNOSIS — G319 Degenerative disease of nervous system, unspecified: Secondary | ICD-10-CM | POA: Insufficient documentation

## 2016-01-22 ENCOUNTER — Encounter: Payer: Self-pay | Admitting: Internal Medicine

## 2016-02-12 ENCOUNTER — Encounter: Payer: Self-pay | Admitting: Internal Medicine

## 2016-03-27 ENCOUNTER — Ambulatory Visit (INDEPENDENT_AMBULATORY_CARE_PROVIDER_SITE_OTHER): Payer: Medicare Other | Admitting: Internal Medicine

## 2016-03-27 ENCOUNTER — Encounter: Payer: Self-pay | Admitting: Internal Medicine

## 2016-03-27 VITALS — BP 136/70 | HR 75 | Temp 98.2°F | Ht 61.0 in | Wt 134.4 lb

## 2016-03-27 DIAGNOSIS — Z23 Encounter for immunization: Secondary | ICD-10-CM

## 2016-03-27 DIAGNOSIS — N179 Acute kidney failure, unspecified: Secondary | ICD-10-CM | POA: Diagnosis not present

## 2016-03-27 DIAGNOSIS — I10 Essential (primary) hypertension: Secondary | ICD-10-CM

## 2016-03-27 DIAGNOSIS — G3184 Mild cognitive impairment, so stated: Secondary | ICD-10-CM | POA: Diagnosis not present

## 2016-03-27 DIAGNOSIS — Z9181 History of falling: Secondary | ICD-10-CM | POA: Diagnosis not present

## 2016-03-27 DIAGNOSIS — Z0000 Encounter for general adult medical examination without abnormal findings: Secondary | ICD-10-CM | POA: Diagnosis not present

## 2016-03-27 LAB — BASIC METABOLIC PANEL
BUN: 22 mg/dL (ref 6–23)
CALCIUM: 9.2 mg/dL (ref 8.4–10.5)
CO2: 25 meq/L (ref 19–32)
Chloride: 105 mEq/L (ref 96–112)
Creatinine, Ser: 0.88 mg/dL (ref 0.40–1.20)
GFR: 63.5 mL/min (ref >60.00)
GLUCOSE: 149 mg/dL — AB (ref 70–99)
POTASSIUM: 4.6 meq/L (ref 3.5–5.1)
Sodium: 139 mEq/L (ref 135–145)

## 2016-03-27 NOTE — Patient Instructions (Signed)
You are doing very well!   I am rechecking your kidney function today with blood work  I will see you in 3 months   Hoping you have a wonderful and blessed Christmas season, and  A HAPPY NEW YEAR    Dr. Derrel Nip

## 2016-03-27 NOTE — Progress Notes (Signed)
Pre visit review using our clinic review tool, if applicable. No additional management support is needed unless otherwise documented below in the visit note. 

## 2016-03-27 NOTE — Progress Notes (Signed)
Patient ID: OPEYEMI NEDROW, female    DOB: 25-Oct-1919  Age: 82 y.o. MRN: [id number]  The patient is here for annual Medicare wellness examination and management of other chronic and acute problems.   The risk factors are reflected in the social history.  The roster of all physicians providing medical care to patient - is listed in the Snapshot section of the chart.  Activities of daily living:  The patient is semi  independent in all ADLs: dressing, toileting, feeding as well as independent mobility.  She continues to live in the [place] at [facility] with supervision by family and 50% of meals provided by the facility. Her daily showers are supervised by an adult. Has a shower seat.    Home safety : The patient has smoke detectors in the home. They wear seatbelts.  There are no firearms at home. There is no violence in the home. Discussed getting covers for the burners on the stove since she does not cook but frequently places flammable objects on the top of the stove.   There is no risks for hepatitis, STDs or HIV. There is no   history of blood transfusion. They have no travel history to infectious disease endemic areas of the world.  The patient has seen their dentist in the last six month. They have seen their eye doctor in the last year.   They have deferred audiologic testing in the last year.  They do not  have excessive sun exposure.   Discussed the need for sun protection: hats, long sleeves and use of sunscreen if there is significant sun exposure.   Diet: the importance of a healthy diet is discussed. They do have a healthy diet.  The benefits of regular aerobic exercise were discussed. She does not participate in any  "silver sneakers" programs    Depression screen: there are no signs or vegative symptoms of depression- irritability, change in appetite, anhedonia, sadness/tearfullness.  Cognitive assessment: the patient does not manage her financial and personal affairs but is  interactive with family and staff . They could not relate day,date,year and events; recalled 2/3 objects at 3 minutes; performed clock-face test normally.  The following portions of the patient's history were reviewed and updated as appropriate: allergies, current medications, past family history, past medical history,  past surgical history, past social history  and problem list.  Visual acuity was not assessed per patient preference since she has regular follow up with her ophthalmologist. Hearing and body mass index were assessed and reviewed.   During the course of the visit the patient was educated and counseled about age appropriate screening and preventive services including : fall prevention , , nutrition counseling, and  recommended immunizations.   During the course of the visit , End of Life objectives were reviewed With son   Patient has a a living will in place and her son has  healthcare power of attorney. She has an out of facility DNR order in place.   CC: The primary encounter diagnosis was Acute renal failure, unspecified acute renal failure type ([place]). Diagnoses of Encounter for immunization, Essential hypertension, benign, History of fall within past 90 days, Mild cognitive impairment, and Encounter for Medicare annual wellness exam were also pertinent to this visit.  follow up on mild cognitive impairment with history of  falls, and dehydration.  Continued cognitive decline is noted by her son who accompanies her today.  The results of previous visit were reviewed with son.   MRI  BRain done after last visit,  No acute changes  atrophic changes noted to have progressed since 2006, old infarct on the left that occurred 12 years ago.    She Has gained 4 lbs. Using the protein shakes on average   4/7 days  to supplement her meals  No bowel bladder incontinence issues  Still living independently at the [facility] with frequent visits from family occurring daily .   History Brieana has  a past medical history of CVA (cerebral vascular accident) ([place]) (11/2004); Diverticulosis; GERD (gastroesophageal reflux disease); History of colon polyps; Hyperlipidemia; Hypertension; MVP (mitral valve prolapse); Obstruction of bowel (3/09); Osteoarthritis; and TIA (transient ischemic attack).   She has a past surgical history that includes Appendectomy; Replacement total knee (1999); Rotator cuff repair; and Cataract extraction (2000).   Her family history includes Alzheimer's disease in her mother; Coronary artery disease in her brother; Diabetes in her brother, mother, and sister.She reports that she has quit smoking. She has never used smokeless tobacco. She reports that she does not use drugs. Her alcohol history is not on file.  Outpatient Medications Prior to Visit  Medication Sig Dispense Refill  . amLODipine (NORVASC) 10 MG tablet TAKE 1 TABLET EVERY DAY 90 tablet 3  . aspirin 81 MG tablet Take 81 mg by mouth daily.     . pravastatin (PRAVACHOL) 40 MG tablet TAKE 1 TABLET AT BEDTIME 90 tablet 3   No facility-administered medications prior to visit.     Review of Systems   Patient denies headache, fevers, malaise, unintentional weight loss, skin rash, eye pain, sinus congestion and sinus pain, sore throat, dysphagia,  hemoptysis , cough, dyspnea, wheezing, chest pain, palpitations, orthopnea, edema, abdominal pain, nausea, melena, diarrhea, constipation, flank pain, dysuria, hematuria, urinary  Frequency, nocturia, numbness, tingling, seizures,  Focal weakness, Loss of consciousness,  Tremor, insomnia, depression, anxiety, and suicidal ideation.      Objective:  BP 136/70   Pulse 75   Temp 98.2 F (36.8 C) (Oral)   Ht 5\' 1"  (1.549 m)   Wt 134 lb 6.4 oz (61 kg)   SpO2 97%   BMI 25.39 kg/m   Physical Exam   General appearance: alert, cooperative and appears stated age Ears: normal TM's and external ear canals both ears Throat: lips, mucosa, and tongue normal; teeth and  gums normal Neck: no adenopathy, no carotid bruit, supple, symmetrical, trachea midline and thyroid not enlarged, symmetric, no tenderness/mass/nodules Back: symmetric, no curvature. ROM normal. No CVA tenderness. Lungs: clear to auscultation bilaterally Heart: regular rate and rhythm, S1, S2 normal, no murmur, click, rub or gallop Abdomen: soft, non-tender; bowel sounds normal; no masses,  no organomegaly Pulses: 2+ and symmetric Skin: Skin color, texture, turgor normal. No rashes or lesions Lymph nodes: Cervical, supraclavicular, and axillary nodes normal.    Assessment & Plan:   Problem List Items Addressed This Visit    Encounter for Medicare annual wellness exam    Annual Medicare wellness  exam was done as well as a comprehensive physical exam and management of acute and chronic conditions .  During the course of the visit the patient was educated and counseled about appropriate screening and preventive services including : fall prevention , , nutrition counseling,and recommended immunizations.  Printed recommendations for health maintenance screenings was given.       Essential hypertension, benign    Well controlled for age on current regimen. Renal function stable, no changes today.  Lab Results  Component Value Date  CREATININE 0.88 03/27/2016   Lab Results  Component Value Date   NA 139 03/27/2016   K 4.6 03/27/2016   CL 105 03/27/2016   CO2 25 03/27/2016         History of fall within past 90 days    Previous fall occurred in the setting of  dehydration and weight loss.  There have been no falls since her last visit, and her nutritional status has improved      Mild cognitive impairment    Her family recognizes continued cognitive decline and have implemented closer supervision on a daily basis with a goal of keeping her in independent living as long as possible        Other Visit Diagnoses    Acute renal failure, unspecified acute renal failure type ([place])     -  Primary   Relevant Orders   Basic metabolic panel (Completed)   Encounter for immunization       Relevant Orders   Flu vaccine HIGH DOSE PF (Completed)      I am having Ms. Outwater maintain her aspirin, pravastatin, and amLODipine.  No orders of the defined types were placed in this encounter.   There are no discontinued medications.  Follow-up: Return in about 3 months (around 06/25/2016).   Crecencio Mc, MD

## 2016-03-29 NOTE — Assessment & Plan Note (Signed)
Annual Medicare wellness  exam was done as well as a comprehensive physical exam and management of acute and chronic conditions .  During the course of the visit the patient was educated and counseled about appropriate screening and preventive services including : fall prevention , , nutrition counseling,and recommended immunizations.  Printed recommendations for health maintenance screenings was given.

## 2016-03-29 NOTE — Assessment & Plan Note (Signed)
Her family recognizes continued cognitive decline and have implemented closer supervision on a daily basis with a goal of keeping her in independent living as long as possible

## 2016-03-29 NOTE — Assessment & Plan Note (Signed)
Well controlled for age on current regimen. Renal function stable, no changes today.  Lab Results  Component Value Date   CREATININE 0.88 03/27/2016   Lab Results  Component Value Date   NA 139 03/27/2016   K 4.6 03/27/2016   CL 105 03/27/2016   CO2 25 03/27/2016

## 2016-03-29 NOTE — Assessment & Plan Note (Signed)
Previous fall occurred in the setting of  dehydration and weight loss.  There have been no falls since her last visit, and her nutritional status has improved

## 2016-03-30 ENCOUNTER — Encounter: Payer: Self-pay | Admitting: *Deleted

## 2016-06-25 ENCOUNTER — Ambulatory Visit (INDEPENDENT_AMBULATORY_CARE_PROVIDER_SITE_OTHER): Payer: Medicare Other | Admitting: Internal Medicine

## 2016-06-25 ENCOUNTER — Encounter: Payer: Self-pay | Admitting: Internal Medicine

## 2016-06-25 DIAGNOSIS — F0150 Vascular dementia without behavioral disturbance: Secondary | ICD-10-CM

## 2016-06-25 NOTE — Progress Notes (Signed)
Pre visit review using our clinic review tool, if applicable. No additional management support is needed unless otherwise documented below in the visit note. 

## 2016-06-25 NOTE — Progress Notes (Signed)
Subjective:  Patient ID: Tracy Novak, female    DOB: 1919-10-04  Age: 83 y.o. MRN: [id number]  CC: The encounter diagnosis was Vascular dementia of acute onset without behavioral disturbance.  HPI Tracy Novak presents for follow up on dementia.  Son is concerned that her cognition is progressing and she is nearing the need to transition from [facility] to assisted living   MMSE done scroe was 14/30  ddiscussed with patient and sone 45 min visit rec assisted living trnasfer   Outpatient Medications Prior to Visit  Medication Sig Dispense Refill  . amLODipine (NORVASC) 10 MG tablet TAKE 1 TABLET EVERY DAY 90 tablet 3  . aspirin 81 MG tablet Take 81 mg by mouth daily.     . pravastatin (PRAVACHOL) 40 MG tablet TAKE 1 TABLET AT BEDTIME 90 tablet 3   No facility-administered medications prior to visit.     Review of Systems;  Patient denies headache, fevers, malaise, unintentional weight loss, skin rash, eye pain, sinus congestion and sinus pain, sore throat, dysphagia,  hemoptysis , cough, dyspnea, wheezing, chest pain, palpitations, orthopnea, edema, abdominal pain, nausea, melena, diarrhea, constipation, flank pain, dysuria, hematuria, urinary  Frequency, nocturia, numbness, tingling, seizures,  Focal weakness, Loss of consciousness,  Tremor, insomnia, depression, anxiety, and suicidal ideation.      Objective:  BP 130/84 (BP Location: Left Arm, Patient Position: Sitting, Cuff Size: Normal)   Pulse 76   Resp 15   Ht 5\' 1"  (1.549 m)   Wt 115 lb 6.4 oz (52.3 kg)   SpO2 97%   BMI 21.80 kg/m   BP Readings from Last 3 Encounters:  06/25/16 130/84  03/27/16 136/70  12/13/15 112/70    Wt Readings from Last 3 Encounters:  06/25/16 115 lb 6.4 oz (52.3 kg)  03/27/16 134 lb 6.4 oz (61 kg)  12/13/15 130 lb 4 oz (59.1 kg)    General appearance: alert, cooperative and appears stated age Ears: normal TM's and external ear canals both ears Throat: lips, mucosa, and tongue  normal; teeth and gums normal Neck: no adenopathy, no carotid bruit, supple, symmetrical, trachea midline and thyroid not enlarged, symmetric, no tenderness/mass/nodules Back: symmetric, no curvature. ROM normal. No CVA tenderness. Lungs: clear to auscultation bilaterally Heart: regular rate and rhythm, S1, S2 normal, no murmur, click, rub or gallop Abdomen: soft, non-tender; bowel sounds normal; no masses,  no organomegaly Pulses: 2+ and symmetric Skin: Skin color, texture, turgor normal. No rashes or lesions Lymph nodes: Cervical, supraclavicular, and axillary nodes normal.  No results found for: HGBA1C  Lab Results  Component Value Date   CREATININE 0.88 03/27/2016   CREATININE 1.46 (H) 12/13/2015   CREATININE 0.97 01/21/2015    Lab Results  Component Value Date   WBC 8.3 01/21/2015   HGB 14.8 01/21/2015   HCT 45.0 01/21/2015   PLT 224.0 01/21/2015   GLUCOSE 149 (H) 03/27/2016   CHOL 163 01/21/2015   TRIG 120.0 01/21/2015   HDL 54.70 01/21/2015   LDLCALC 84 01/21/2015   ALT 18 12/13/2015   AST 39 (H) 12/13/2015   NA 139 03/27/2016   K 4.6 03/27/2016   CL 105 03/27/2016   CREATININE 0.88 03/27/2016   BUN 22 03/27/2016   CO2 25 03/27/2016   TSH 2.27 12/13/2015    Mr Brain Wo Contrast  Result Date: 01/02/2016 CLINICAL DATA:  Transient alteration of awareness.  Recent fall. EXAM: MRI HEAD WITHOUT CONTRAST TECHNIQUE: Multiplanar, multiecho pulse sequences of the brain and surrounding  structures were obtained without intravenous contrast. COMPARISON:  MRI 12/01/2004 FINDINGS: Brain: Moderate atrophy. Ventricular enlargement acute atrophy. Mild progression of atrophy since 2006. Negative for acute infarct. Chronic infarct left anterior pons. Mild chronic microvascular ischemic change in the cerebral white matter. Benign cyst left basal ganglia unchanged. Negative for intracranial hemorrhage. Negative for mass or edema. No shift of the midline structures. Vascular: Normal flow  voids. Skull and upper cervical spine: Normal marrow signal. Sinuses/Orbits: Bilateral lens replacement. No orbital lesion. Mild mucosal edema in the paranasal sinuses. Other: Normal soft tissues. IMPRESSION: Atrophy and chronic ischemic changes especially in the left pons. No acute abnormality. Electronically Signed   By: Franchot Gallo M.D.   On: 01/02/2016 12:34    Assessment & Plan:   Problem List Items Addressed This Visit    Vascular dementia of acute onset without behavioral disturbance    Progressing.  MMSE was 14/30 today. A total of 40 minutes was spent with patient and son, more than half of which was spent in counseling patient on the need to transition to assisted living         A total of 40 minutes was spent with patient more than half of which was spent in counseling patient on the above mentioned issues , reviewing and explaining recent labs and imaging studies done, and coordination of care.   I am having Tracy Novak maintain her aspirin, pravastatin, and amLODipine.  No orders of the defined types were placed in this encounter.   There are no discontinued medications.  Follow-up: No Follow-up on file.   Crecencio Mc, MD

## 2016-06-27 NOTE — Assessment & Plan Note (Signed)
Progressing.  MMSE was 14/30 today. A total of 40 minutes was spent with patient and son, more than half of which was spent in counseling patient on the need to transition to assisted living

## 2016-07-01 ENCOUNTER — Ambulatory Visit (INDEPENDENT_AMBULATORY_CARE_PROVIDER_SITE_OTHER): Payer: Medicare Other | Admitting: *Deleted

## 2016-07-01 DIAGNOSIS — Z111 Encounter for screening for respiratory tuberculosis: Secondary | ICD-10-CM

## 2016-07-03 ENCOUNTER — Ambulatory Visit: Payer: Medicare Other | Admitting: *Deleted

## 2016-07-03 ENCOUNTER — Telehealth: Payer: Self-pay | Admitting: Internal Medicine

## 2016-07-03 ENCOUNTER — Other Ambulatory Visit: Payer: Self-pay | Admitting: *Deleted

## 2016-07-03 DIAGNOSIS — Z7689 Persons encountering health services in other specified circumstances: Secondary | ICD-10-CM

## 2016-07-03 DIAGNOSIS — Z111 Encounter for screening for respiratory tuberculosis: Secondary | ICD-10-CM

## 2016-07-03 LAB — TB SKIN TEST
Induration: 0 mm
TB Skin Test: NEGATIVE

## 2016-07-03 NOTE — Telephone Encounter (Signed)
Tracy Novak intake forms completed

## 2016-07-03 NOTE — Telephone Encounter (Signed)
Tracy Novak forms placed at front desk , son (DPR) notified to pick up paper work ready copy for scan and copy for billing.

## 2016-07-03 NOTE — Progress Notes (Signed)
Patient presented for PPD read to enter [facility] patient is wanting to move in this afternoon, paperwork has been completed all except PCP signature, and PCP standing orders and physical health report.

## 2016-07-06 ENCOUNTER — Telehealth: Payer: Self-pay | Admitting: Internal Medicine

## 2016-07-06 NOTE — Telephone Encounter (Signed)
Pt's son dropped off [place] papers to be signed . Papers are up in color folder.

## 2016-07-06 NOTE — Telephone Encounter (Signed)
Paperwork was not in colored folder up front.

## 2016-07-07 ENCOUNTER — Other Ambulatory Visit: Payer: Self-pay | Admitting: Internal Medicine

## 2016-07-07 DIAGNOSIS — Z7689 Persons encountering health services in other specified circumstances: Secondary | ICD-10-CM

## 2016-07-07 MED ORDER — AMLODIPINE BESYLATE 10 MG PO TABS: 10.0000 mg | ORAL_TABLET | Freq: Every day | ORAL | 0 refills | Status: DC

## 2016-07-07 NOTE — Telephone Encounter (Signed)
Pt son called requesting refills on pt's amLODipine (NORVASC) 10 MG tablet and pravastatin (PRAVACHOL) 40 MG tablet. Please advise, thank you!  [place], [place]

## 2016-07-07 NOTE — Telephone Encounter (Signed)
Spoke with pt's son, [name], to let him know that the disability paperwork has been completed and is up front to be picked up. I also made him aware of the $50 fee for the filling out the paperwork. He gave a verbal understanding.

## 2016-07-07 NOTE — Telephone Encounter (Signed)
Disability form is completed and will be returned to you in red folder  Charge is $50

## 2016-07-07 NOTE — Telephone Encounter (Signed)
Paperwork was found and has been put in red folder.

## 2016-07-07 NOTE — Telephone Encounter (Signed)
Medication has not been filled by you before nor has it been refilled since 03/30/2014. Last office visit was 06/25/2016 and next office visit is 09/25/2016. Pt has not had a lipid panel drawn since 01/21/2015.

## 2016-07-07 NOTE — Progress Notes (Signed)
  I have reviewed the above information and agree with above.   Lillith Mcneff, MD 

## 2016-07-08 MED ORDER — PRAVASTATIN SODIUM 40 MG PO TABS
40.0000 mg | ORAL_TABLET | Freq: Every day | ORAL | 3 refills | Status: DC
Start: 2016-07-08 — End: 2016-11-05

## 2016-07-08 NOTE — Telephone Encounter (Signed)
Refilled

## 2016-07-30 DIAGNOSIS — D485 Neoplasm of uncertain behavior of skin: Secondary | ICD-10-CM | POA: Diagnosis not present

## 2016-07-30 DIAGNOSIS — L905 Scar conditions and fibrosis of skin: Secondary | ICD-10-CM | POA: Diagnosis not present

## 2016-07-30 DIAGNOSIS — D0461 Carcinoma in situ of skin of right upper limb, including shoulder: Secondary | ICD-10-CM | POA: Diagnosis not present

## 2016-07-30 DIAGNOSIS — L821 Other seborrheic keratosis: Secondary | ICD-10-CM | POA: Diagnosis not present

## 2016-07-30 DIAGNOSIS — Z85828 Personal history of other malignant neoplasm of skin: Secondary | ICD-10-CM | POA: Diagnosis not present

## 2016-07-30 DIAGNOSIS — L814 Other melanin hyperpigmentation: Secondary | ICD-10-CM | POA: Diagnosis not present

## 2016-07-30 DIAGNOSIS — Z08 Encounter for follow-up examination after completed treatment for malignant neoplasm: Secondary | ICD-10-CM | POA: Diagnosis not present

## 2016-08-05 ENCOUNTER — Other Ambulatory Visit: Payer: Self-pay | Admitting: Internal Medicine

## 2016-08-05 ENCOUNTER — Telehealth: Payer: Self-pay | Admitting: *Deleted

## 2016-08-05 MED ORDER — AMLODIPINE BESYLATE 10 MG PO TABS: 10.0000 mg | ORAL_TABLET | Freq: Every day | ORAL | 0 refills | Status: DC

## 2016-08-05 NOTE — Telephone Encounter (Signed)
Medication Refill requested for :amlodiopine  [facility]  Return Contact :

## 2016-09-02 DIAGNOSIS — D0461 Carcinoma in situ of skin of right upper limb, including shoulder: Secondary | ICD-10-CM | POA: Diagnosis not present

## 2016-09-02 DIAGNOSIS — L905 Scar conditions and fibrosis of skin: Secondary | ICD-10-CM | POA: Diagnosis not present

## 2016-09-25 ENCOUNTER — Ambulatory Visit: Payer: Self-pay | Admitting: Internal Medicine

## 2016-09-30 ENCOUNTER — Ambulatory Visit: Payer: Self-pay | Admitting: Internal Medicine

## 2016-11-03 ENCOUNTER — Ambulatory Visit (INDEPENDENT_AMBULATORY_CARE_PROVIDER_SITE_OTHER): Payer: Medicare Other | Admitting: Internal Medicine

## 2016-11-03 ENCOUNTER — Encounter: Payer: Self-pay | Admitting: Internal Medicine

## 2016-11-03 VITALS — BP 130/72 | HR 83 | Temp 98.1°F | Resp 15 | Ht 61.0 in | Wt 138.8 lb

## 2016-11-03 DIAGNOSIS — F0150 Vascular dementia without behavioral disturbance: Secondary | ICD-10-CM

## 2016-11-03 DIAGNOSIS — N183 Chronic kidney disease, stage 3 unspecified: Secondary | ICD-10-CM

## 2016-11-03 DIAGNOSIS — R600 Localized edema: Secondary | ICD-10-CM | POA: Diagnosis not present

## 2016-11-03 DIAGNOSIS — I10 Essential (primary) hypertension: Secondary | ICD-10-CM

## 2016-11-03 DIAGNOSIS — Z7189 Other specified counseling: Secondary | ICD-10-CM | POA: Diagnosis not present

## 2016-11-03 LAB — COMPREHENSIVE METABOLIC PANEL
ALT: 11 U/L (ref 0–35)
AST: 22 U/L (ref 0–37)
Albumin: 4.2 g/dL (ref 3.5–5.2)
Alkaline Phosphatase: 69 U/L (ref 39–117)
BUN: 20 mg/dL (ref 6–23)
CHLORIDE: 105 meq/L (ref 96–112)
CO2: 26 meq/L (ref 19–32)
CREATININE: 1.23 mg/dL — AB (ref 0.40–1.20)
Calcium: 9.4 mg/dL (ref 8.4–10.5)
GFR: 43.09 mL/min — ABNORMAL LOW (ref >60.00)
Glucose, Bld: 114 mg/dL — ABNORMAL HIGH (ref 70–99)
Potassium: 4.1 mEq/L (ref 3.5–5.1)
SODIUM: 139 meq/L (ref 135–145)
Total Bilirubin: 0.4 mg/dL (ref 0.2–1.2)
Total Protein: 7 g/dL (ref 6.0–8.3)

## 2016-11-03 MED ORDER — FUROSEMIDE 20 MG PO TABS: 20.0000 mg | ORAL_TABLET | ORAL | 3 refills | Status: DC

## 2016-11-03 NOTE — Patient Instructions (Addendum)
We are stopping your cholesterol medication (pravastatin)  Continue amlodipine and aspirin every day    Adding furosemide  every other day in the morning for fluid retention  It will make you urinate a lot that morning   See you in 4 months

## 2016-11-03 NOTE — Progress Notes (Signed)
Subjective:  Patient ID: Tracy Novak, female    DOB: 1919-10-24  Age: 83 y.o. MRN: [id number]  CC: The primary encounter diagnosis was Hypertension, unspecified type. Diagnoses of CKD (chronic kidney disease) stage 3, GFR 30-59 ml/min, Essential hypertension, benign, Advanced directives, counseling/discussion, Vascular dementia of acute onset without behavioral disturbance, and Localized edema were also pertinent to this visit.  HPI Tracy Novak presents for FOLLOW UP ON HYPERTENSIO, HYPERLIIDMEIA AND DEMENTIA .   Weight gain of 23 lbs note since march 2018 When she moved into [facility] mass index is 26.23 kg/m.  Likes living at  [facility].  Eating 3 meals per day  Received her veteran's survivor benefit in 3 weeks . Receiving $1176/month and SS is $1283 .  Monthly cost is $4200 per month.  Son is subsidizing.   Had a brief episodes of chest pain this morning,  Can't describe it , how long it lasted, whether it was brought on by activity.  Whether she has had it before.   Has developed edema in lower extremities around the ankles.  No pain , redness,.  Outpatient Medications Prior to Visit  Medication Sig Dispense Refill  . amLODipine (NORVASC) 10 MG tablet Take 1 tablet (10 mg total) by mouth daily. 90 tablet 0  . aspirin 81 MG tablet Take 81 mg by mouth daily.     . pravastatin (PRAVACHOL) 40 MG tablet Take 1 tablet (40 mg total) by mouth at bedtime. 90 tablet 3  . amLODipine (NORVASC) 10 MG tablet TAKE ONE TABLET BY MOUTH EVERY DAY (Patient not taking: Reported on 11/03/2016) 90 tablet 2   No facility-administered medications prior to visit.     Review of Systems;  Patient denies headache, fevers, malaise, unintentional weight loss, skin rash, eye pain, sinus congestion and sinus pain, sore throat, dysphagia,  hemoptysis , cough, dyspnea, wheezing, chest pain, palpitations, orthopnea, edema, abdominal pain, nausea, melena, diarrhea, constipation, flank pain, dysuria,  hematuria, urinary  Frequency, nocturia, numbness, tingling, seizures,  Focal weakness, Loss of consciousness,  Tremor, insomnia, depression, anxiety, and suicidal ideation.      Objective:  BP 130/72 (BP Location: Left Arm, Patient Position: Sitting, Cuff Size: Normal)   Pulse 83   Temp 98.1 F (36.7 C) (Oral)   Resp 15   Ht 5\' 1"  (1.549 m)   Wt 138 lb 12.8 oz (63 kg)   SpO2 98%   BMI 26.23 kg/m   BP Readings from Last 3 Encounters:  11/03/16 130/72  06/25/16 130/84  03/27/16 136/70    Wt Readings from Last 3 Encounters:  11/03/16 138 lb 12.8 oz (63 kg)  06/25/16 115 lb 6.4 oz (52.3 kg)  03/27/16 134 lb 6.4 oz (61 kg)    General appearance: alert, cooperative and appears stated age Ears: normal TM's and external ear canals both ears Throat: lips, mucosa, and tongue normal; teeth and gums normal Neck: no adenopathy, no carotid bruit, supple, symmetrical, trachea midline and thyroid not enlarged, symmetric, no tenderness/mass/nodules Back: symmetric, no curvature. ROM normal. No CVA tenderness. Lungs: clear to auscultation bilaterally Heart: regular rate and rhythm, S1, S2 normal, no murmur, click, rub or gallop Abdomen: soft, non-tender; bowel sounds normal; no masses,  no organomegaly Pulses: 2+ and symmetric Skin: Skin color, texture, turgor normal. No rashes or lesions. Nonpitting edema of ankles.  Lymph nodes: Cervical, supraclavicular, and axillary nodes normal.  No results found for: HGBA1C  Lab Results  Component Value Date   CREATININE 1.23 (  H) 11/03/2016   CREATININE 0.88 03/27/2016   CREATININE 1.46 (H) 12/13/2015    Lab Results  Component Value Date   WBC 8.3 01/21/2015   HGB 14.8 01/21/2015   HCT 45.0 01/21/2015   PLT 224.0 01/21/2015   GLUCOSE 114 (H) 11/03/2016   CHOL 163 01/21/2015   TRIG 120.0 01/21/2015   HDL 54.70 01/21/2015   LDLCALC 84 01/21/2015   ALT 11 11/03/2016   AST 22 11/03/2016   NA 139 11/03/2016   K 4.1 11/03/2016   CL 105  11/03/2016   CREATININE 1.23 (H) 11/03/2016   BUN 20 11/03/2016   CO2 26 11/03/2016   TSH 2.27 12/13/2015    Mr Brain Wo Contrast  Result Date: 01/02/2016 CLINICAL DATA:  Transient alteration of awareness.  Recent fall. EXAM: MRI HEAD WITHOUT CONTRAST TECHNIQUE: Multiplanar, multiecho pulse sequences of the brain and surrounding structures were obtained without intravenous contrast. COMPARISON:  MRI 12/01/2004 FINDINGS: Brain: Moderate atrophy. Ventricular enlargement acute atrophy. Mild progression of atrophy since 2006. Negative for acute infarct. Chronic infarct left anterior pons. Mild chronic microvascular ischemic change in the cerebral white matter. Benign cyst left basal ganglia unchanged. Negative for intracranial hemorrhage. Negative for mass or edema. No shift of the midline structures. Vascular: Normal flow voids. Skull and upper cervical spine: Normal marrow signal. Sinuses/Orbits: Bilateral lens replacement. No orbital lesion. Mild mucosal edema in the paranasal sinuses. Other: Normal soft tissues. IMPRESSION: Atrophy and chronic ischemic changes especially in the left pons. No acute abnormality. Electronically Signed   By: Franchot Gallo M.D.   On: 01/02/2016 12:34    Assessment & Plan:   Problem List Items Addressed This Visit    Advanced directives, counseling/discussion    Stopping pravastatin given age.       CKD (chronic kidney disease) stage 3, GFR 30-59 ml/min    No workup given her age,  Limit use of  lasix to once a week,  Void nsaids.       Essential hypertension, benign    Well controlled on current regimen. Renal function stable, no changes today.  Lab Results  Component Value Date   CREATININE 1.23 (H) 11/03/2016  \ Lab Results  Component Value Date   NA 139 11/03/2016   K 4.1 11/03/2016   CL 105 11/03/2016   CO2 26 11/03/2016         Relevant Medications   furosemide (LASIX) 20 MG tablet   Localized edema    Recommended leg elevation when  possible,  Daily walking,  And use of lasix nor moe than once daily       Vascular dementia of acute onset without behavioral disturbance    Advanced, With weight loss of over 30 lbs, now  transitioned to to assisted living .  She has adjusted well to [facility].  She has gained weight        Other Visit Diagnoses    Hypertension, unspecified type    -  Primary   Relevant Medications   furosemide (LASIX) 20 MG tablet   Other Relevant Orders   Comprehensive metabolic panel (Completed)      I have discontinued Ms. Dixson's pravastatin. I am also having her start on furosemide. Additionally, I am having her maintain her aspirin and amLODipine.  Meds ordered this encounter  Medications  . furosemide (LASIX) 20 MG tablet    Sig: Take 1 tablet (20 mg total) by mouth every other day.    Dispense:  45 tablet  Refill:  3    Medications Discontinued During This Encounter  Medication Reason  . amLODipine (NORVASC) 10 MG tablet Duplicate  . pravastatin (PRAVACHOL) 40 MG tablet     Follow-up: Return in about 4 months (around 03/06/2017).   Crecencio Mc, MD

## 2016-11-05 DIAGNOSIS — N183 Chronic kidney disease, stage 3 unspecified: Secondary | ICD-10-CM | POA: Insufficient documentation

## 2016-11-05 DIAGNOSIS — R600 Localized edema: Secondary | ICD-10-CM | POA: Insufficient documentation

## 2016-11-05 NOTE — Assessment & Plan Note (Addendum)
Advanced, With weight loss of over 30 lbs, now  transitioned to to assisted living .  She has adjusted well to [facility].  She has gained weight

## 2016-11-05 NOTE — Assessment & Plan Note (Signed)
Recommended leg elevation when possible,  Daily walking,  And use of lasix nor moe than once daily

## 2016-11-05 NOTE — Assessment & Plan Note (Addendum)
Stopping pravastatin given age.

## 2016-11-05 NOTE — Assessment & Plan Note (Signed)
No workup given her age,  Limit use of  lasix to once a week,  Void nsaids.

## 2016-11-05 NOTE — Assessment & Plan Note (Signed)
Well controlled on current regimen. Renal function stable, no changes today.  Lab Results  Component Value Date   CREATININE 1.23 (H) 11/03/2016   Lab Results  Component Value Date   NA 139 11/03/2016   K 4.1 11/03/2016   CL 105 11/03/2016   CO2 26 11/03/2016    

## 2017-01-22 DIAGNOSIS — Z23 Encounter for immunization: Secondary | ICD-10-CM | POA: Diagnosis not present

## 2017-03-08 ENCOUNTER — Encounter: Payer: Self-pay | Admitting: Internal Medicine

## 2017-03-08 ENCOUNTER — Ambulatory Visit (INDEPENDENT_AMBULATORY_CARE_PROVIDER_SITE_OTHER): Payer: Medicare Other | Admitting: Internal Medicine

## 2017-03-08 VITALS — BP 112/76 | HR 75 | Temp 98.4°F | Resp 15 | Ht 61.0 in | Wt 144.0 lb

## 2017-03-08 DIAGNOSIS — R600 Localized edema: Secondary | ICD-10-CM

## 2017-03-08 DIAGNOSIS — I10 Essential (primary) hypertension: Secondary | ICD-10-CM

## 2017-03-08 DIAGNOSIS — N183 Chronic kidney disease, stage 3 unspecified: Secondary | ICD-10-CM

## 2017-03-08 DIAGNOSIS — F0150 Vascular dementia without behavioral disturbance: Secondary | ICD-10-CM | POA: Diagnosis not present

## 2017-03-08 MED ORDER — ZOSTER VAC RECOMB ADJUVANTED 50 MCG/0.5ML IM SUSR: 0.5000 mL | Freq: Once | INTRAMUSCULAR | 1 refills | Status: AC

## 2017-03-08 NOTE — Patient Instructions (Addendum)
I recommend increasing the furosemide to every day in the morning   We will repeat your labs in one month   Continue the amlodipine     The ShingRx vaccine is now available in local pharmacies and is much more protective thant Zostavaxs,  It is therefore ADVISED for all interested adults over 50 to prevent shingles

## 2017-03-08 NOTE — Progress Notes (Signed)
Subjective:  Patient ID: Tracy Novak, female    DOB: [birth year]  Age: 83 y.o. MRN: [id number]  CC:   HPI :    Tracy Novak presents for follow up on chronic medical issues including hypertension and dementia.  She is Accompanied by her son . Patient has adapted well to assisted living at [facility].   She is well dressed ,  And in good spirits.  Per son hse has developed urinary incontinence and progression of cognitive decline .     Outpatient Medications Prior to Visit  Medication Sig Dispense Refill  . amLODipine (NORVASC) 10 MG tablet Take 1 tablet (10 mg total) by mouth daily. 90 tablet 0  . aspirin 81 MG tablet Take 81 mg by mouth daily.     . furosemide (LASIX) 20 MG tablet Take 1 tablet (20 mg total) by mouth every other day. 45 tablet 3   No facility-administered medications prior to visit.     Review of Systems;  Patient denies headache, fevers, malaise, unintentional weight loss, skin rash, eye pain, sinus congestion and sinus pain, sore throat, dysphagia,  hemoptysis , cough, dyspnea, wheezing, chest pain, palpitations, orthopnea, edema, abdominal pain, nausea, melena, diarrhea, constipation, flank pain, dysuria, hematuria, urinary  Frequency, nocturia, numbness, tingling, seizures,  Focal weakness, Loss of consciousness,  Tremor, insomnia, depression, anxiety, and suicidal ideation.      Objective:  BP 112/76 (BP Location: Left Arm, Patient Position: Sitting, Cuff Size: Large)   Pulse 75   Temp 98.4 F (36.9 C) (Oral)   Resp 15   Ht 5\' 1"  (1.549 m)   Wt 144 lb (65.3 kg)   SpO2 95%   BMI 27.21 kg/m   BP Readings from Last 3 Encounters:  03/08/17 112/76  11/03/16 130/72  06/25/16 130/84    Wt Readings from Last 3 Encounters:  03/08/17 144 lb (65.3 kg)  11/03/16 138 lb 12.8 oz (63 kg)  06/25/16 115 lb 6.4 oz (52.3 kg)    General appearance: alert, cooperative and appears stated age Ears: normal TM's and external ear canals both ears Throat: lips,  mucosa, and tongue normal; teeth and gums normal Neck: no adenopathy, no carotid bruit, supple, symmetrical, trachea midline and thyroid not enlarged, symmetric, no tenderness/mass/nodules Back: symmetric, no curvature. ROM normal. No CVA tenderness. Lungs: clear to auscultation bilaterally Heart: regular rate and rhythm, S1, S2 normal, no murmur, click, rub or gallop Abdomen: soft, non-tender; bowel sounds normal; no masses,  no organomegaly Pulses: 2+ and symmetric Skin: 2  Pitting edema with brawny skin changes.  Tibias are tender to palpation due to edema.  No bruising noted. Lymph nodes: Cervical, supraclavicular, and axillary nodes normal.  No results found for: HGBA1C  Lab Results  Component Value Date   CREATININE 1.23 (H) 11/03/2016   CREATININE 0.88 03/27/2016   CREATININE 1.46 (H) 12/13/2015    Lab Results  Component Value Date   WBC 8.3 01/21/2015   HGB 14.8 01/21/2015   HCT 45.0 01/21/2015   PLT 224.0 01/21/2015   GLUCOSE 114 (H) 11/03/2016   CHOL 163 01/21/2015   TRIG 120.0 01/21/2015   HDL 54.70 01/21/2015   LDLCALC 84 01/21/2015   ALT 11 11/03/2016   AST 22 11/03/2016   NA 139 11/03/2016   K 4.1 11/03/2016   CL 105 11/03/2016   CREATININE 1.23 (H) 11/03/2016   BUN 20 11/03/2016   CO2 26 11/03/2016   TSH 2.27 12/13/2015    Mr Brain Wo Contrast  Result Date: 01/02/2016 CLINICAL DATA:  Transient alteration of awareness.  Recent fall. EXAM: MRI HEAD WITHOUT CONTRAST TECHNIQUE: Multiplanar, multiecho pulse sequences of the brain and surrounding structures were obtained without intravenous contrast. COMPARISON:  MRI 12/01/2004 FINDINGS: Brain: Moderate atrophy. Ventricular enlargement acute atrophy. Mild progression of atrophy since 2006. Negative for acute infarct. Chronic infarct left anterior pons. Mild chronic microvascular ischemic change in the cerebral white matter. Benign cyst left basal ganglia unchanged. Negative for intracranial hemorrhage. Negative for  mass or edema. No shift of the midline structures. Vascular: Normal flow voids. Skull and upper cervical spine: Normal marrow signal. Sinuses/Orbits: Bilateral lens replacement. No orbital lesion. Mild mucosal edema in the paranasal sinuses. Other: Normal soft tissues. IMPRESSION: Atrophy and chronic ischemic changes especially in the left pons. No acute abnormality. Electronically Signed   By: Franchot Gallo M.D.   On: 01/02/2016 12:34    Assessment & Plan:   Problem List Items Addressed This Visit    CKD (chronic kidney disease) stage 3, GFR 30-59 ml/min (HCC) - Primary    No workup given her age,   Avoid NSAIDs      Relevant Orders   Comprehensive metabolic panel   Essential hypertension, benign    Well controlled on current regimen. Renal function stable, no changes today.  Lab Results  Component Value Date   CREATININE 1.23 (H) 11/03/2016   Lab Results  Component Value Date   NA 139 11/03/2016   K 4.1 11/03/2016   CL 105 11/03/2016   CO2 26 11/03/2016         Localized edema    I have increased the lasix to daily given the symptomatic  pitting edema noted on her exam today .  rtc one month for labs.       Vascular dementia of acute onset without behavioral disturbance    Now managed with assisted living at [facility] .  Has developed incontinence (urge) and worsening memory. No behavioral issues.        A total of 25 minutes of face to face time was spent with patient and sone, more than half of which was spent in counselling about the above mentioned conditions  and coordination of care   I am having Tommye R. Prichard start on Zoster Vaccine Adjuvanted. I am also having her maintain her aspirin, amLODipine, and furosemide.  Meds ordered this encounter  Medications  . Zoster Vaccine Adjuvanted [facility]) injection    Sig: Inject 0.5 mLs into the muscle once for 1 dose.    Dispense:  1 each    Refill:  1    There are no discontinued medications.  Follow-up: No  Follow-up on file.   Crecencio Mc, MD

## 2017-03-09 NOTE — Assessment & Plan Note (Signed)
Well controlled on current regimen. Renal function stable, no changes today.  Lab Results  Component Value Date   CREATININE 1.23 (H) 11/03/2016   Lab Results  Component Value Date   NA 139 11/03/2016   K 4.1 11/03/2016   CL 105 11/03/2016   CO2 26 11/03/2016

## 2017-03-09 NOTE — Assessment & Plan Note (Signed)
No workup given her age,   Avoid NSAIDs

## 2017-03-09 NOTE — Assessment & Plan Note (Signed)
I have increased the lasix to daily given the symptomatic  pitting edema noted on her exam today .  rtc one month for labs.

## 2017-03-09 NOTE — Assessment & Plan Note (Signed)
Now managed with assisted living at [facility] .  Has developed incontinence (urge) and worsening memory. No behavioral issues.

## 2017-03-12 ENCOUNTER — Telehealth: Payer: Self-pay

## 2017-03-12 NOTE — Telephone Encounter (Signed)
Verbal order given to [facility] at [facility] for L hip xray due to fall. Confirmed that written order was received via fax. Will follow up.

## 2017-03-13 DIAGNOSIS — M25552 Pain in left hip: Secondary | ICD-10-CM | POA: Diagnosis not present

## 2017-03-17 DIAGNOSIS — L814 Other melanin hyperpigmentation: Secondary | ICD-10-CM | POA: Diagnosis not present

## 2017-03-17 DIAGNOSIS — L821 Other seborrheic keratosis: Secondary | ICD-10-CM | POA: Diagnosis not present

## 2017-03-17 DIAGNOSIS — Z85828 Personal history of other malignant neoplasm of skin: Secondary | ICD-10-CM | POA: Diagnosis not present

## 2017-03-17 DIAGNOSIS — D485 Neoplasm of uncertain behavior of skin: Secondary | ICD-10-CM | POA: Diagnosis not present

## 2017-07-12 ENCOUNTER — Encounter: Payer: Self-pay | Admitting: Internal Medicine

## 2017-07-12 ENCOUNTER — Ambulatory Visit (INDEPENDENT_AMBULATORY_CARE_PROVIDER_SITE_OTHER): Payer: Medicare Other | Admitting: Internal Medicine

## 2017-07-12 DIAGNOSIS — R600 Localized edema: Secondary | ICD-10-CM

## 2017-07-12 DIAGNOSIS — F0150 Vascular dementia without behavioral disturbance: Secondary | ICD-10-CM

## 2017-07-12 MED ORDER — FUROSEMIDE 20 MG PO TABS: 20.0000 mg | ORAL_TABLET | Freq: Every day | ORAL | 3 refills | Status: DC

## 2017-07-12 MED ORDER — FUROSEMIDE 20 MG PO TABS: 20.0000 mg | ORAL_TABLET | ORAL | 3 refills | Status: DC

## 2017-07-12 NOTE — Assessment & Plan Note (Signed)
With significant weight loss prrio to relocation to assisted living at [facility] .  Has developed incontinence (urge) and worsening memory. No behavioral issues.

## 2017-07-12 NOTE — Progress Notes (Signed)
Pre-visit discussion using our clinic review tool. No additional management support is needed unless otherwise documented below in the visit note.  

## 2017-07-12 NOTE — Assessment & Plan Note (Signed)
Secondary to venous stasis .  Resuming lasxi every other day to abod development of venous ulcers

## 2017-07-12 NOTE — Progress Notes (Signed)
Subjective:  Patient ID: Tracy Novak, female    DOB: [birth year]  Age: 84 y.o. MRN: [id number]  CC: Diagnoses of Localized edema and Vascular dementia of acute onset without behavioral disturbance were pertinent to this visit.  HPI Tracy Novak presents for 6 month follow up  On vascular dementia, hypertension with CKD. Tracy Novak is accompanied by her son.  He states that her dementia has progressed and she has become much more forgetful . She spends most of her time in her room in her recliner.   LE edema,  bilateral  worse .  Lasix  dose was reduced to once a week in July due to bump in Cr from o.88 to 1.23 since then her legs have become more progressively swollen around the ankles and extending to mid shin.  She has developed venous stasis changes,  Otherwise she is doing well and has acclimated to  [facility] [employer] [facility] (First [facility]) , has regained some of the weight she lost due to forgetting to eat.   discusse  With son the tradeoff of resuming more frequent lasix dosing given the inability of patient to tolerate daily use of compression stockings and he understands the consequences of increasing the diuretic to every other day again.  She has a  Armed forces technical officer in her room at [employer] and spends most of her day in her room   Outpatient Medications Prior to Visit  Medication Sig Dispense Refill  . amLODipine (NORVASC) 10 MG tablet Take 1 tablet (10 mg total) by mouth daily. 90 tablet 0  . aspirin 81 MG tablet Take 81 mg by mouth daily.     . furosemide (LASIX) 20 MG tablet Take 1 tablet (20 mg total) by mouth every other day. 45 tablet 3   No facility-administered medications prior to visit.     Review of Systems;  Patient denies headache, fevers, malaise, unintentional weight loss, skin rash, eye pain, sinus congestion and sinus pain, sore throat, dysphagia,  hemoptysis , cough, dyspnea, wheezing, chest pain, palpitations, orthopnea, edema, abdominal pain, nausea, melena, diarrhea,  constipation, flank pain, dysuria, hematuria, urinary  Frequency, nocturia, numbness, tingling, seizures,  Focal weakness, Loss of consciousness,  Tremor, insomnia, depression, anxiety, and suicidal ideation.      Objective:  BP (!) 148/88 (BP Location: Left Arm, Patient Position: Sitting, Cuff Size: Normal)   Pulse 83   Temp 97.7 F (36.5 C) (Oral)   Resp 16   Ht 5\' 1"  (1.549 m)   Wt 140 lb (63.5 kg)   SpO2 96%   BMI 26.45 kg/m   BP Readings from Last 3 Encounters:  07/12/17 (!) 148/88  03/08/17 112/76  11/03/16 130/72    Wt Readings from Last 3 Encounters:  07/12/17 140 lb (63.5 kg)  03/08/17 144 lb (65.3 kg)  11/03/16 138 lb 12.8 oz (63 kg)    General appearance: alert, cooperative and appears stated age Ears: normal TM's and external ear canals both ears Throat: lips, mucosa, and tongue normal; teeth and gums normal Neck: no adenopathy, no carotid bruit, supple, symmetrical, trachea midline and thyroid not enlarged, symmetric, no tenderness/mass/nodules Back: symmetric, no curvature. ROM normal. No CVA tenderness. Lungs: clear to auscultation bilaterally Heart: regular rate and rhythm, S1, S2 normal, no murmur, click, rub or gallop Abdomen: soft, non-tender; bowel sounds normal; no masses,  no organomegaly Pulses: 2+ and symmetric Skin: bilateral 1+ PITING EDEMA WITH VENOUS STASIS CHANGES  Lymph nodes: Cervical, supraclavicular, and axillary nodes normal.  No  results found for: HGBA1C  Lab Results  Component Value Date   CREATININE 1.23 (H) 11/03/2016   CREATININE 0.88 03/27/2016   CREATININE 1.46 (H) 12/13/2015    Lab Results  Component Value Date   WBC 8.3 01/21/2015   HGB 14.8 01/21/2015   HCT 45.0 01/21/2015   PLT 224.0 01/21/2015   GLUCOSE 114 (H) 11/03/2016   CHOL 163 01/21/2015   TRIG 120.0 01/21/2015   HDL 54.70 01/21/2015   LDLCALC 84 01/21/2015   ALT 11 11/03/2016   AST 22 11/03/2016   NA 139 11/03/2016   K 4.1 11/03/2016   CL 105  11/03/2016   CREATININE 1.23 (H) 11/03/2016   BUN 20 11/03/2016   CO2 26 11/03/2016   TSH 2.27 12/13/2015     Assessment & Plan:   Problem List Items Addressed This Visit    Vascular dementia of acute onset without behavioral disturbance    With significant weight loss prrio to relocation to assisted living at [facility] .  Has developed incontinence (urge) and worsening memory. No behavioral issues.       Localized edema    Secondary to venous stasis .  Resuming lasxi every other day to abod development of venous ulcers          I am having Tracy Novak maintain her aspirin, amLODipine, and furosemide.  Meds ordered this encounter  Medications  . DISCONTD: furosemide (LASIX) 20 MG tablet    Sig: Take 1 tablet (20 mg total) by mouth daily.    Dispense:  90 tablet    Refill:  3  . DISCONTD: furosemide (LASIX) 20 MG tablet    Sig: Take 1 tablet (20 mg total) by mouth every other day.    Dispense:  90 tablet    Refill:  3  . furosemide (LASIX) 20 MG tablet    Sig: Take 1 tablet (20 mg total) by mouth every other day.    Dispense:  90 tablet    Refill:  3    Medications Discontinued During This Encounter  Medication Reason  . furosemide (LASIX) 20 MG tablet   . furosemide (LASIX) 20 MG tablet   . furosemide (LASIX) 20 MG tablet Reorder    Follow-up: No follow-ups on file.   Crecencio Mc, MD

## 2017-07-12 NOTE — Progress Notes (Signed)
Resent furosemide to [facility] for patient.

## 2017-07-12 NOTE — Patient Instructions (Addendum)
For the fluid retention  ,  We will resume the dosiing  lasix to every other day   Elevate her legs whenever sitting   Have staff moisturize legs  Daily wit hCetaphil,  Eucerin,  Or Aveeno

## 2017-07-28 DIAGNOSIS — Z08 Encounter for follow-up examination after completed treatment for malignant neoplasm: Secondary | ICD-10-CM | POA: Diagnosis not present

## 2017-07-28 DIAGNOSIS — D485 Neoplasm of uncertain behavior of skin: Secondary | ICD-10-CM | POA: Diagnosis not present

## 2017-07-28 DIAGNOSIS — Z85828 Personal history of other malignant neoplasm of skin: Secondary | ICD-10-CM | POA: Diagnosis not present

## 2017-07-28 DIAGNOSIS — I872 Venous insufficiency (chronic) (peripheral): Secondary | ICD-10-CM | POA: Diagnosis not present

## 2017-07-28 DIAGNOSIS — L821 Other seborrheic keratosis: Secondary | ICD-10-CM | POA: Diagnosis not present

## 2017-12-07 ENCOUNTER — Telehealth: Payer: Self-pay | Admitting: Internal Medicine

## 2017-12-07 NOTE — Telephone Encounter (Signed)
Spoke with [facility] transferred to admission had to leave message for [facility] admissions coordinator to return call to office.

## 2017-12-07 NOTE — Telephone Encounter (Signed)
Patient's son, Aliyanna Wassmer called about need FL-2 ASAP for mother Tifini.  [facility] has a bed available  for  so she can transfer from [facility]    .  He also wants to set up an apptfor me to see her .  Can you handle?  [phone]

## 2017-12-08 ENCOUNTER — Telehealth: Payer: Self-pay | Admitting: Internal Medicine

## 2017-12-08 ENCOUNTER — Telehealth: Payer: Self-pay | Admitting: *Deleted

## 2017-12-08 NOTE — Telephone Encounter (Signed)
Admissions coordinator called back and delivered FL2 form to facility.

## 2017-12-08 NOTE — Telephone Encounter (Signed)
The FL-2 form has been completed and signed.  Burnett Sheng said if you call her she will drop by to pick it up

## 2017-12-08 NOTE — Telephone Encounter (Signed)
Copied from [place] [id number]. Topic: Appointment Scheduling - Scheduling Inquiry for Clinic >> Dec 08, 2017 10:46 AM Rutherford Nail, NT wrote: Reason for CRM: Patient son, Gershon Mussel, calling and states that he needs an appointment within the next 7 days to get an FL-2 form filled out for the patient to be able to go to [facility]. Judithann Sheen that the earliest appointment Dr Derrel Nip has is in October. Patient states that it needs to be done before then. Please advise. CB#: [phone]

## 2017-12-08 NOTE — Telephone Encounter (Signed)
Talked with patient son and he states patient has worsening dementia , and would like evaluation for FL2 for placement at [facility] advised patient son have called and received FL2 from [facility].

## 2017-12-08 NOTE — Telephone Encounter (Signed)
Placed in red folder  

## 2017-12-08 NOTE — Telephone Encounter (Signed)
Completed at lunch,  Please call for pickup

## 2017-12-09 NOTE — Telephone Encounter (Signed)
Tracy Novak and advised patient Tracy Novak is ready for pick up.

## 2017-12-09 NOTE — Telephone Encounter (Signed)
Form has been placed up front for pick up. 

## 2017-12-10 ENCOUNTER — Ambulatory Visit (INDEPENDENT_AMBULATORY_CARE_PROVIDER_SITE_OTHER): Payer: Medicare Other | Admitting: Internal Medicine

## 2017-12-10 VITALS — BP 140/84 | HR 67 | Temp 98.2°F | Resp 15 | Ht 61.0 in | Wt 136.8 lb

## 2017-12-10 DIAGNOSIS — F0150 Vascular dementia without behavioral disturbance: Secondary | ICD-10-CM | POA: Diagnosis not present

## 2017-12-10 DIAGNOSIS — Z23 Encounter for immunization: Secondary | ICD-10-CM

## 2017-12-10 DIAGNOSIS — M1711 Unilateral primary osteoarthritis, right knee: Secondary | ICD-10-CM | POA: Diagnosis not present

## 2017-12-10 DIAGNOSIS — G8929 Other chronic pain: Secondary | ICD-10-CM | POA: Insufficient documentation

## 2017-12-10 DIAGNOSIS — M25562 Pain in left knee: Secondary | ICD-10-CM

## 2017-12-10 NOTE — Progress Notes (Signed)
Subjective:  Patient ID: Tracy Novak, female    DOB: 10/22/1919  Age: 84 y.o. MRN: [id number]  CC: The primary encounter diagnosis was Need for influenza vaccination. Diagnoses of Need for diphtheria-tetanus-pertussis (Tdap) vaccine, Vascular dementia without behavioral disturbance, Chronic pain of left knee, and Primary osteoarthritis of right knee were also pertinent to this visit.  HPI Tracy Novak presents for FOLLOW UP on worsening dementia.  She is accompanied  by her son Gershon Mussel. Continued weight loss noted.  She has become incontinent of urine ,  She does not eat unless food is prepared for her .  Cannot take her own medications.  Cannot remember yesterday's events.  Using a walker 98% of the time and a cane when she has personnel support.  Cannot rise from seated position without assistance due to leg weakness and knee pain .  Needs a higher level of care,  Son has accepted a bed offer at SNF at [facility] .  Wants Physical Therapy ONCE THE TRANSITION IS MADE     Outpatient Medications Prior to Visit  Medication Sig Dispense Refill  . amLODipine (NORVASC) 10 MG tablet Take 1 tablet (10 mg total) by mouth daily. 90 tablet 0  . aspirin 81 MG tablet Take 81 mg by mouth daily.     . furosemide (LASIX) 20 MG tablet Take 1 tablet (20 mg total) by mouth every other day. 90 tablet 3   No facility-administered medications prior to visit.     Review of Systems;  Patient denies headache, fevers, malaise, unintentional weight loss, skin rash, eye pain, sinus congestion and sinus pain, sore throat, dysphagia,  hemoptysis , cough, dyspnea, wheezing, chest pain, palpitations, orthopnea, edema, abdominal pain, nausea, melena, diarrhea, constipation, flank pain, dysuria, hematuria, urinary  Frequency, nocturia, numbness, tingling, seizures,  Focal weakness, Loss of consciousness,  Tremor, insomnia, depression, anxiety, and suicidal ideation.      Objective:  BP 140/84 (BP Location: Left Arm, Patient  Position: Sitting, Cuff Size: Normal)   Pulse 67   Temp 98.2 F (36.8 C) (Oral)   Resp 15   Ht 5\' 1"  (1.549 m)   Wt 136 lb 12.8 oz (62.1 kg)   SpO2 98%   BMI 25.85 kg/m   BP Readings from Last 3 Encounters:  12/10/17 140/84  07/12/17 (!) 148/88  03/08/17 112/76    Wt Readings from Last 3 Encounters:  12/10/17 136 lb 12.8 oz (62.1 kg)  07/12/17 140 lb (63.5 kg)  03/08/17 144 lb (65.3 kg)    General appearance: alert, cooperative and appears stated age Ears: normal TM's and external ear canals both ears Throat: lips, mucosa, and tongue normal; teeth and gums normal Neck: no adenopathy, no carotid bruit, supple, symmetrical, trachea midline and thyroid not enlarged, symmetric, no tenderness/mass/nodules Back: symmetric, no curvature. ROM normal. No CVA tenderness. Lungs: clear to auscultation bilaterally Heart: regular rate and rhythm, S1, S2 normal, no murmur, click, rub or gallop Abdomen: soft, non-tender; bowel sounds normal; no masses,  no organomegaly Pulses: 2+ and symmetric Skin: Skin color, texture, turgor normal. No rashes or lesions Lymph nodes: Cervical, supraclavicular, and axillary nodes normal.  Neuro:  awake and interactive with normal Novak and affect. Higher cortical functions are now showng deficits. normal. Speech is clear with some word-finding difficulty , without dysarthria. Extraocular movements are intact. Visual fields of both eyes are grossly intact. Sensation to light touch is grossly intact bilaterally of upper and lower extremities. Motor examination shows 4+/5 symmetric hand  grip and upper extremity and 5/5 lower extremity strength. There is no pronation or drift. Gait is unsteady without walker, and she requires assistance rising from seated position    No results found for: HGBA1C  Lab Results  Component Value Date   CREATININE 1.23 (H) 11/03/2016   CREATININE 0.88 03/27/2016   CREATININE 1.46 (H) 12/13/2015    Lab Results  Component Value  Date   WBC 8.3 01/21/2015   HGB 14.8 01/21/2015   HCT 45.0 01/21/2015   PLT 224.0 01/21/2015   GLUCOSE 114 (H) 11/03/2016   CHOL 163 01/21/2015   TRIG 120.0 01/21/2015   HDL 54.70 01/21/2015   LDLCALC 84 01/21/2015   ALT 11 11/03/2016   AST 22 11/03/2016   NA 139 11/03/2016   K 4.1 11/03/2016   CL 105 11/03/2016   CREATININE 1.23 (H) 11/03/2016   BUN 20 11/03/2016   CO2 26 11/03/2016   TSH 2.27 12/13/2015     Assessment & Plan:   Problem List Items Addressed This Visit    Chronic pain of left knee     Secondary to DJD.  Using a walker and cane.  Needs PT       Osteoarthritis of right knee    Symptomatic,  Recommend scheduled use of tylenol.       Vascular dementia without behavioral disturbance    PROGRESSIVE,  SHE IS NOW UNABLE TO LIVE without supervision and assistance with ADLs and medication supervision  .  agree with transition to skilled nursing due to progressive loss of function       Other Visit Diagnoses    Need for influenza vaccination    -  Primary   Relevant Orders   Flu vaccine HIGH DOSE PF (Fluzone High dose) (Completed)   Need for diphtheria-tetanus-pertussis (Tdap) vaccine         A total of 25 minutes of face to face time was spent with patient more than half of which was spent in counselling and coordination of care  I am having Amari R. Klose maintain her aspirin, amLODipine, and furosemide.  No orders of the defined types were placed in this encounter.   There are no discontinued medications.  Follow-up: No follow-ups on file.   Crecencio Mc, MD

## 2017-12-10 NOTE — Assessment & Plan Note (Addendum)
PROGRESSIVE,  SHE IS NOW UNABLE TO LIVE without supervision and assistance with ADLs and medication supervision  .  agree with transition to skilled nursing due to progressive loss of function

## 2017-12-10 NOTE — Patient Instructions (Signed)
  You can add up to 2000 mg of acetominophen (tylenol) every day safely  In divided doses (500 mg every 6 hours  Or 1000 mg every 12 hours.)  For Tracy Novak's pain   Physical Therapy referral to be doe at [facility]

## 2017-12-10 NOTE — Assessment & Plan Note (Signed)
Secondary to DJD.  Using a walker and cane.  Needs PT

## 2017-12-12 NOTE — Assessment & Plan Note (Signed)
Symptomatic,  Recommend scheduled use of tylenol.

## 2017-12-20 DIAGNOSIS — I10 Essential (primary) hypertension: Secondary | ICD-10-CM | POA: Diagnosis not present

## 2017-12-20 DIAGNOSIS — M159 Polyosteoarthritis, unspecified: Secondary | ICD-10-CM | POA: Diagnosis not present

## 2017-12-20 DIAGNOSIS — I872 Venous insufficiency (chronic) (peripheral): Secondary | ICD-10-CM | POA: Diagnosis not present

## 2017-12-20 DIAGNOSIS — F0150 Vascular dementia without behavioral disturbance: Secondary | ICD-10-CM | POA: Diagnosis not present

## 2017-12-23 DIAGNOSIS — M25562 Pain in left knee: Secondary | ICD-10-CM | POA: Diagnosis not present

## 2017-12-23 DIAGNOSIS — N183 Chronic kidney disease, stage 3 (moderate): Secondary | ICD-10-CM | POA: Diagnosis not present

## 2017-12-23 DIAGNOSIS — M6281 Muscle weakness (generalized): Secondary | ICD-10-CM | POA: Diagnosis not present

## 2017-12-23 DIAGNOSIS — R2689 Other abnormalities of gait and mobility: Secondary | ICD-10-CM | POA: Diagnosis not present

## 2017-12-23 DIAGNOSIS — Z741 Need for assistance with personal care: Secondary | ICD-10-CM | POA: Diagnosis not present

## 2017-12-23 DIAGNOSIS — M1710 Unilateral primary osteoarthritis, unspecified knee: Secondary | ICD-10-CM | POA: Diagnosis not present

## 2017-12-23 DIAGNOSIS — R278 Other lack of coordination: Secondary | ICD-10-CM | POA: Diagnosis not present

## 2017-12-23 DIAGNOSIS — R4189 Other symptoms and signs involving cognitive functions and awareness: Secondary | ICD-10-CM | POA: Diagnosis not present

## 2017-12-24 DIAGNOSIS — N183 Chronic kidney disease, stage 3 (moderate): Secondary | ICD-10-CM | POA: Diagnosis not present

## 2017-12-24 DIAGNOSIS — R4189 Other symptoms and signs involving cognitive functions and awareness: Secondary | ICD-10-CM | POA: Diagnosis not present

## 2017-12-24 DIAGNOSIS — M6281 Muscle weakness (generalized): Secondary | ICD-10-CM | POA: Diagnosis not present

## 2017-12-24 DIAGNOSIS — R278 Other lack of coordination: Secondary | ICD-10-CM | POA: Diagnosis not present

## 2017-12-24 DIAGNOSIS — M1710 Unilateral primary osteoarthritis, unspecified knee: Secondary | ICD-10-CM | POA: Diagnosis not present

## 2017-12-24 DIAGNOSIS — R2689 Other abnormalities of gait and mobility: Secondary | ICD-10-CM | POA: Diagnosis not present

## 2017-12-27 DIAGNOSIS — R4189 Other symptoms and signs involving cognitive functions and awareness: Secondary | ICD-10-CM | POA: Diagnosis not present

## 2017-12-27 DIAGNOSIS — R278 Other lack of coordination: Secondary | ICD-10-CM | POA: Diagnosis not present

## 2017-12-27 DIAGNOSIS — N183 Chronic kidney disease, stage 3 (moderate): Secondary | ICD-10-CM | POA: Diagnosis not present

## 2017-12-27 DIAGNOSIS — M6281 Muscle weakness (generalized): Secondary | ICD-10-CM | POA: Diagnosis not present

## 2017-12-27 DIAGNOSIS — M1710 Unilateral primary osteoarthritis, unspecified knee: Secondary | ICD-10-CM | POA: Diagnosis not present

## 2017-12-27 DIAGNOSIS — R2689 Other abnormalities of gait and mobility: Secondary | ICD-10-CM | POA: Diagnosis not present

## 2017-12-28 DIAGNOSIS — M6281 Muscle weakness (generalized): Secondary | ICD-10-CM | POA: Diagnosis not present

## 2017-12-28 DIAGNOSIS — R4189 Other symptoms and signs involving cognitive functions and awareness: Secondary | ICD-10-CM | POA: Diagnosis not present

## 2017-12-28 DIAGNOSIS — M1710 Unilateral primary osteoarthritis, unspecified knee: Secondary | ICD-10-CM | POA: Diagnosis not present

## 2017-12-28 DIAGNOSIS — R2689 Other abnormalities of gait and mobility: Secondary | ICD-10-CM | POA: Diagnosis not present

## 2017-12-28 DIAGNOSIS — R278 Other lack of coordination: Secondary | ICD-10-CM | POA: Diagnosis not present

## 2017-12-28 DIAGNOSIS — N183 Chronic kidney disease, stage 3 (moderate): Secondary | ICD-10-CM | POA: Diagnosis not present

## 2017-12-29 DIAGNOSIS — R278 Other lack of coordination: Secondary | ICD-10-CM | POA: Diagnosis not present

## 2017-12-29 DIAGNOSIS — N183 Chronic kidney disease, stage 3 (moderate): Secondary | ICD-10-CM | POA: Diagnosis not present

## 2017-12-29 DIAGNOSIS — M1710 Unilateral primary osteoarthritis, unspecified knee: Secondary | ICD-10-CM | POA: Diagnosis not present

## 2017-12-29 DIAGNOSIS — R2689 Other abnormalities of gait and mobility: Secondary | ICD-10-CM | POA: Diagnosis not present

## 2017-12-29 DIAGNOSIS — M6281 Muscle weakness (generalized): Secondary | ICD-10-CM | POA: Diagnosis not present

## 2017-12-29 DIAGNOSIS — R4189 Other symptoms and signs involving cognitive functions and awareness: Secondary | ICD-10-CM | POA: Diagnosis not present

## 2017-12-30 DIAGNOSIS — N183 Chronic kidney disease, stage 3 (moderate): Secondary | ICD-10-CM | POA: Diagnosis not present

## 2017-12-30 DIAGNOSIS — R2689 Other abnormalities of gait and mobility: Secondary | ICD-10-CM | POA: Diagnosis not present

## 2017-12-30 DIAGNOSIS — M1710 Unilateral primary osteoarthritis, unspecified knee: Secondary | ICD-10-CM | POA: Diagnosis not present

## 2017-12-30 DIAGNOSIS — M6281 Muscle weakness (generalized): Secondary | ICD-10-CM | POA: Diagnosis not present

## 2017-12-30 DIAGNOSIS — R4189 Other symptoms and signs involving cognitive functions and awareness: Secondary | ICD-10-CM | POA: Diagnosis not present

## 2017-12-30 DIAGNOSIS — R278 Other lack of coordination: Secondary | ICD-10-CM | POA: Diagnosis not present

## 2017-12-31 DIAGNOSIS — R2689 Other abnormalities of gait and mobility: Secondary | ICD-10-CM | POA: Diagnosis not present

## 2017-12-31 DIAGNOSIS — M6281 Muscle weakness (generalized): Secondary | ICD-10-CM | POA: Diagnosis not present

## 2017-12-31 DIAGNOSIS — M1710 Unilateral primary osteoarthritis, unspecified knee: Secondary | ICD-10-CM | POA: Diagnosis not present

## 2017-12-31 DIAGNOSIS — R278 Other lack of coordination: Secondary | ICD-10-CM | POA: Diagnosis not present

## 2017-12-31 DIAGNOSIS — R4189 Other symptoms and signs involving cognitive functions and awareness: Secondary | ICD-10-CM | POA: Diagnosis not present

## 2017-12-31 DIAGNOSIS — N183 Chronic kidney disease, stage 3 (moderate): Secondary | ICD-10-CM | POA: Diagnosis not present

## 2018-01-03 DIAGNOSIS — N183 Chronic kidney disease, stage 3 (moderate): Secondary | ICD-10-CM | POA: Diagnosis not present

## 2018-01-03 DIAGNOSIS — R4189 Other symptoms and signs involving cognitive functions and awareness: Secondary | ICD-10-CM | POA: Diagnosis not present

## 2018-01-03 DIAGNOSIS — M6281 Muscle weakness (generalized): Secondary | ICD-10-CM | POA: Diagnosis not present

## 2018-01-03 DIAGNOSIS — R278 Other lack of coordination: Secondary | ICD-10-CM | POA: Diagnosis not present

## 2018-01-03 DIAGNOSIS — R2689 Other abnormalities of gait and mobility: Secondary | ICD-10-CM | POA: Diagnosis not present

## 2018-01-03 DIAGNOSIS — M1710 Unilateral primary osteoarthritis, unspecified knee: Secondary | ICD-10-CM | POA: Diagnosis not present

## 2018-01-04 DIAGNOSIS — R278 Other lack of coordination: Secondary | ICD-10-CM | POA: Diagnosis not present

## 2018-01-04 DIAGNOSIS — R4189 Other symptoms and signs involving cognitive functions and awareness: Secondary | ICD-10-CM | POA: Diagnosis not present

## 2018-01-04 DIAGNOSIS — M1710 Unilateral primary osteoarthritis, unspecified knee: Secondary | ICD-10-CM | POA: Diagnosis not present

## 2018-01-04 DIAGNOSIS — R2689 Other abnormalities of gait and mobility: Secondary | ICD-10-CM | POA: Diagnosis not present

## 2018-01-04 DIAGNOSIS — M6281 Muscle weakness (generalized): Secondary | ICD-10-CM | POA: Diagnosis not present

## 2018-01-04 DIAGNOSIS — N183 Chronic kidney disease, stage 3 (moderate): Secondary | ICD-10-CM | POA: Diagnosis not present

## 2018-01-06 DIAGNOSIS — N183 Chronic kidney disease, stage 3 (moderate): Secondary | ICD-10-CM | POA: Diagnosis not present

## 2018-01-06 DIAGNOSIS — R4189 Other symptoms and signs involving cognitive functions and awareness: Secondary | ICD-10-CM | POA: Diagnosis not present

## 2018-01-06 DIAGNOSIS — M6281 Muscle weakness (generalized): Secondary | ICD-10-CM | POA: Diagnosis not present

## 2018-01-06 DIAGNOSIS — R2689 Other abnormalities of gait and mobility: Secondary | ICD-10-CM | POA: Diagnosis not present

## 2018-01-06 DIAGNOSIS — R278 Other lack of coordination: Secondary | ICD-10-CM | POA: Diagnosis not present

## 2018-01-06 DIAGNOSIS — M1710 Unilateral primary osteoarthritis, unspecified knee: Secondary | ICD-10-CM | POA: Diagnosis not present

## 2018-01-07 ENCOUNTER — Telehealth: Payer: Self-pay | Admitting: Internal Medicine

## 2018-01-07 NOTE — Telephone Encounter (Signed)
Left message for patients son to return call back. PEC may give information.

## 2018-01-07 NOTE — Telephone Encounter (Signed)
No, he does not. To keep her oct 2 appt with me,  So cancel it. Please.  Let him know that   Dr Silvio Pate sees all new residents within their first week of residence at [facility]

## 2018-01-07 NOTE — Telephone Encounter (Signed)
FYI...should pt keep her last appt on 01/12/2018?

## 2018-01-07 NOTE — Telephone Encounter (Signed)
Copied from [place] [id number]. Topic: General - Other >> Jan 07, 2018 11:14 AM Sheran Luz wrote: Reason for CRM: Pts son, Gershon Mussel, called to let office know his mother will now be under care of Dr. Silvio Pate, as office is closer to [facility] skilled nursing facility where pt was moved. Gershon Mussel states that he has previously let office know this. Gershon Mussel would like to know if his mother should keep her last appointment on 10/2. Please advise.   Tom also wanted to thank Dr. Derrel Nip for being exceptional and taking great care of his mother.

## 2018-01-07 NOTE — Telephone Encounter (Signed)
Calling about appointment

## 2018-01-08 DIAGNOSIS — R4189 Other symptoms and signs involving cognitive functions and awareness: Secondary | ICD-10-CM | POA: Diagnosis not present

## 2018-01-08 DIAGNOSIS — R2689 Other abnormalities of gait and mobility: Secondary | ICD-10-CM | POA: Diagnosis not present

## 2018-01-08 DIAGNOSIS — M1710 Unilateral primary osteoarthritis, unspecified knee: Secondary | ICD-10-CM | POA: Diagnosis not present

## 2018-01-08 DIAGNOSIS — M6281 Muscle weakness (generalized): Secondary | ICD-10-CM | POA: Diagnosis not present

## 2018-01-08 DIAGNOSIS — N183 Chronic kidney disease, stage 3 (moderate): Secondary | ICD-10-CM | POA: Diagnosis not present

## 2018-01-08 DIAGNOSIS — R278 Other lack of coordination: Secondary | ICD-10-CM | POA: Diagnosis not present

## 2018-01-10 DIAGNOSIS — R4189 Other symptoms and signs involving cognitive functions and awareness: Secondary | ICD-10-CM | POA: Diagnosis not present

## 2018-01-10 DIAGNOSIS — R2689 Other abnormalities of gait and mobility: Secondary | ICD-10-CM | POA: Diagnosis not present

## 2018-01-10 DIAGNOSIS — R278 Other lack of coordination: Secondary | ICD-10-CM | POA: Diagnosis not present

## 2018-01-10 DIAGNOSIS — M6281 Muscle weakness (generalized): Secondary | ICD-10-CM | POA: Diagnosis not present

## 2018-01-10 DIAGNOSIS — N183 Chronic kidney disease, stage 3 (moderate): Secondary | ICD-10-CM | POA: Diagnosis not present

## 2018-01-10 DIAGNOSIS — M1710 Unilateral primary osteoarthritis, unspecified knee: Secondary | ICD-10-CM | POA: Diagnosis not present

## 2018-01-10 NOTE — Telephone Encounter (Signed)
Spoke with pt's son to let him know that the pt does not need to keep her appt with Dr. Derrel Nip on October 2nd. Son is aware and appt has been canceled.

## 2018-01-11 DIAGNOSIS — F0150 Vascular dementia without behavioral disturbance: Secondary | ICD-10-CM | POA: Diagnosis not present

## 2018-01-11 DIAGNOSIS — I872 Venous insufficiency (chronic) (peripheral): Secondary | ICD-10-CM | POA: Diagnosis not present

## 2018-01-11 DIAGNOSIS — M6281 Muscle weakness (generalized): Secondary | ICD-10-CM | POA: Diagnosis not present

## 2018-01-11 DIAGNOSIS — M159 Polyosteoarthritis, unspecified: Secondary | ICD-10-CM | POA: Diagnosis not present

## 2018-01-11 DIAGNOSIS — R278 Other lack of coordination: Secondary | ICD-10-CM | POA: Diagnosis not present

## 2018-01-11 DIAGNOSIS — M25562 Pain in left knee: Secondary | ICD-10-CM | POA: Diagnosis not present

## 2018-01-11 DIAGNOSIS — Z741 Need for assistance with personal care: Secondary | ICD-10-CM | POA: Diagnosis not present

## 2018-01-11 DIAGNOSIS — I10 Essential (primary) hypertension: Secondary | ICD-10-CM | POA: Diagnosis not present

## 2018-01-11 DIAGNOSIS — N183 Chronic kidney disease, stage 3 (moderate): Secondary | ICD-10-CM | POA: Diagnosis not present

## 2018-01-11 DIAGNOSIS — R4189 Other symptoms and signs involving cognitive functions and awareness: Secondary | ICD-10-CM | POA: Diagnosis not present

## 2018-01-11 DIAGNOSIS — R2689 Other abnormalities of gait and mobility: Secondary | ICD-10-CM | POA: Diagnosis not present

## 2018-01-12 ENCOUNTER — Ambulatory Visit: Payer: Self-pay | Admitting: Internal Medicine

## 2018-01-12 DIAGNOSIS — N183 Chronic kidney disease, stage 3 (moderate): Secondary | ICD-10-CM | POA: Diagnosis not present

## 2018-01-12 DIAGNOSIS — R4189 Other symptoms and signs involving cognitive functions and awareness: Secondary | ICD-10-CM | POA: Diagnosis not present

## 2018-01-12 DIAGNOSIS — M25562 Pain in left knee: Secondary | ICD-10-CM | POA: Diagnosis not present

## 2018-01-12 DIAGNOSIS — R278 Other lack of coordination: Secondary | ICD-10-CM | POA: Diagnosis not present

## 2018-01-12 DIAGNOSIS — R2689 Other abnormalities of gait and mobility: Secondary | ICD-10-CM | POA: Diagnosis not present

## 2018-01-12 DIAGNOSIS — M6281 Muscle weakness (generalized): Secondary | ICD-10-CM | POA: Diagnosis not present

## 2018-01-14 DIAGNOSIS — M6281 Muscle weakness (generalized): Secondary | ICD-10-CM | POA: Diagnosis not present

## 2018-01-14 DIAGNOSIS — R278 Other lack of coordination: Secondary | ICD-10-CM | POA: Diagnosis not present

## 2018-01-14 DIAGNOSIS — R4189 Other symptoms and signs involving cognitive functions and awareness: Secondary | ICD-10-CM | POA: Diagnosis not present

## 2018-01-14 DIAGNOSIS — R2689 Other abnormalities of gait and mobility: Secondary | ICD-10-CM | POA: Diagnosis not present

## 2018-01-14 DIAGNOSIS — M25562 Pain in left knee: Secondary | ICD-10-CM | POA: Diagnosis not present

## 2018-01-14 DIAGNOSIS — N183 Chronic kidney disease, stage 3 (moderate): Secondary | ICD-10-CM | POA: Diagnosis not present

## 2018-01-17 DIAGNOSIS — R2689 Other abnormalities of gait and mobility: Secondary | ICD-10-CM | POA: Diagnosis not present

## 2018-01-17 DIAGNOSIS — M6281 Muscle weakness (generalized): Secondary | ICD-10-CM | POA: Diagnosis not present

## 2018-01-17 DIAGNOSIS — M25562 Pain in left knee: Secondary | ICD-10-CM | POA: Diagnosis not present

## 2018-01-17 DIAGNOSIS — R4189 Other symptoms and signs involving cognitive functions and awareness: Secondary | ICD-10-CM | POA: Diagnosis not present

## 2018-01-17 DIAGNOSIS — R278 Other lack of coordination: Secondary | ICD-10-CM | POA: Diagnosis not present

## 2018-01-17 DIAGNOSIS — N183 Chronic kidney disease, stage 3 (moderate): Secondary | ICD-10-CM | POA: Diagnosis not present

## 2018-01-18 DIAGNOSIS — M25562 Pain in left knee: Secondary | ICD-10-CM | POA: Diagnosis not present

## 2018-01-18 DIAGNOSIS — R4189 Other symptoms and signs involving cognitive functions and awareness: Secondary | ICD-10-CM | POA: Diagnosis not present

## 2018-01-18 DIAGNOSIS — N183 Chronic kidney disease, stage 3 (moderate): Secondary | ICD-10-CM | POA: Diagnosis not present

## 2018-01-18 DIAGNOSIS — M6281 Muscle weakness (generalized): Secondary | ICD-10-CM | POA: Diagnosis not present

## 2018-01-18 DIAGNOSIS — R2689 Other abnormalities of gait and mobility: Secondary | ICD-10-CM | POA: Diagnosis not present

## 2018-01-18 DIAGNOSIS — R278 Other lack of coordination: Secondary | ICD-10-CM | POA: Diagnosis not present

## 2018-01-19 DIAGNOSIS — M6281 Muscle weakness (generalized): Secondary | ICD-10-CM | POA: Diagnosis not present

## 2018-01-19 DIAGNOSIS — R278 Other lack of coordination: Secondary | ICD-10-CM | POA: Diagnosis not present

## 2018-01-19 DIAGNOSIS — M25562 Pain in left knee: Secondary | ICD-10-CM | POA: Diagnosis not present

## 2018-01-19 DIAGNOSIS — R2689 Other abnormalities of gait and mobility: Secondary | ICD-10-CM | POA: Diagnosis not present

## 2018-01-19 DIAGNOSIS — N183 Chronic kidney disease, stage 3 (moderate): Secondary | ICD-10-CM | POA: Diagnosis not present

## 2018-01-19 DIAGNOSIS — R4189 Other symptoms and signs involving cognitive functions and awareness: Secondary | ICD-10-CM | POA: Diagnosis not present

## 2018-01-20 DIAGNOSIS — R278 Other lack of coordination: Secondary | ICD-10-CM | POA: Diagnosis not present

## 2018-01-20 DIAGNOSIS — R2689 Other abnormalities of gait and mobility: Secondary | ICD-10-CM | POA: Diagnosis not present

## 2018-01-20 DIAGNOSIS — R4189 Other symptoms and signs involving cognitive functions and awareness: Secondary | ICD-10-CM | POA: Diagnosis not present

## 2018-01-20 DIAGNOSIS — N183 Chronic kidney disease, stage 3 (moderate): Secondary | ICD-10-CM | POA: Diagnosis not present

## 2018-01-20 DIAGNOSIS — M25562 Pain in left knee: Secondary | ICD-10-CM | POA: Diagnosis not present

## 2018-01-20 DIAGNOSIS — M6281 Muscle weakness (generalized): Secondary | ICD-10-CM | POA: Diagnosis not present

## 2018-01-23 DIAGNOSIS — M6281 Muscle weakness (generalized): Secondary | ICD-10-CM | POA: Diagnosis not present

## 2018-01-23 DIAGNOSIS — R278 Other lack of coordination: Secondary | ICD-10-CM | POA: Diagnosis not present

## 2018-01-23 DIAGNOSIS — R2689 Other abnormalities of gait and mobility: Secondary | ICD-10-CM | POA: Diagnosis not present

## 2018-01-23 DIAGNOSIS — M25562 Pain in left knee: Secondary | ICD-10-CM | POA: Diagnosis not present

## 2018-01-23 DIAGNOSIS — R4189 Other symptoms and signs involving cognitive functions and awareness: Secondary | ICD-10-CM | POA: Diagnosis not present

## 2018-01-23 DIAGNOSIS — N183 Chronic kidney disease, stage 3 (moderate): Secondary | ICD-10-CM | POA: Diagnosis not present

## 2018-01-24 DIAGNOSIS — R278 Other lack of coordination: Secondary | ICD-10-CM | POA: Diagnosis not present

## 2018-01-24 DIAGNOSIS — R2689 Other abnormalities of gait and mobility: Secondary | ICD-10-CM | POA: Diagnosis not present

## 2018-01-24 DIAGNOSIS — R4189 Other symptoms and signs involving cognitive functions and awareness: Secondary | ICD-10-CM | POA: Diagnosis not present

## 2018-01-24 DIAGNOSIS — M6281 Muscle weakness (generalized): Secondary | ICD-10-CM | POA: Diagnosis not present

## 2018-01-24 DIAGNOSIS — N183 Chronic kidney disease, stage 3 (moderate): Secondary | ICD-10-CM | POA: Diagnosis not present

## 2018-01-24 DIAGNOSIS — M25562 Pain in left knee: Secondary | ICD-10-CM | POA: Diagnosis not present

## 2018-01-25 DIAGNOSIS — N183 Chronic kidney disease, stage 3 (moderate): Secondary | ICD-10-CM | POA: Diagnosis not present

## 2018-01-25 DIAGNOSIS — R2689 Other abnormalities of gait and mobility: Secondary | ICD-10-CM | POA: Diagnosis not present

## 2018-01-25 DIAGNOSIS — R4189 Other symptoms and signs involving cognitive functions and awareness: Secondary | ICD-10-CM | POA: Diagnosis not present

## 2018-01-25 DIAGNOSIS — R278 Other lack of coordination: Secondary | ICD-10-CM | POA: Diagnosis not present

## 2018-01-25 DIAGNOSIS — M25562 Pain in left knee: Secondary | ICD-10-CM | POA: Diagnosis not present

## 2018-01-25 DIAGNOSIS — M6281 Muscle weakness (generalized): Secondary | ICD-10-CM | POA: Diagnosis not present

## 2018-01-26 DIAGNOSIS — M6281 Muscle weakness (generalized): Secondary | ICD-10-CM | POA: Diagnosis not present

## 2018-01-26 DIAGNOSIS — M25562 Pain in left knee: Secondary | ICD-10-CM | POA: Diagnosis not present

## 2018-01-26 DIAGNOSIS — R278 Other lack of coordination: Secondary | ICD-10-CM | POA: Diagnosis not present

## 2018-01-26 DIAGNOSIS — N183 Chronic kidney disease, stage 3 (moderate): Secondary | ICD-10-CM | POA: Diagnosis not present

## 2018-01-26 DIAGNOSIS — R2689 Other abnormalities of gait and mobility: Secondary | ICD-10-CM | POA: Diagnosis not present

## 2018-01-26 DIAGNOSIS — R4189 Other symptoms and signs involving cognitive functions and awareness: Secondary | ICD-10-CM | POA: Diagnosis not present

## 2018-01-27 DIAGNOSIS — M6281 Muscle weakness (generalized): Secondary | ICD-10-CM | POA: Diagnosis not present

## 2018-01-27 DIAGNOSIS — M25562 Pain in left knee: Secondary | ICD-10-CM | POA: Diagnosis not present

## 2018-01-27 DIAGNOSIS — R2689 Other abnormalities of gait and mobility: Secondary | ICD-10-CM | POA: Diagnosis not present

## 2018-01-27 DIAGNOSIS — R278 Other lack of coordination: Secondary | ICD-10-CM | POA: Diagnosis not present

## 2018-01-27 DIAGNOSIS — N183 Chronic kidney disease, stage 3 (moderate): Secondary | ICD-10-CM | POA: Diagnosis not present

## 2018-01-27 DIAGNOSIS — R4189 Other symptoms and signs involving cognitive functions and awareness: Secondary | ICD-10-CM | POA: Diagnosis not present

## 2018-01-28 DIAGNOSIS — N183 Chronic kidney disease, stage 3 (moderate): Secondary | ICD-10-CM | POA: Diagnosis not present

## 2018-01-28 DIAGNOSIS — R278 Other lack of coordination: Secondary | ICD-10-CM | POA: Diagnosis not present

## 2018-01-28 DIAGNOSIS — R2689 Other abnormalities of gait and mobility: Secondary | ICD-10-CM | POA: Diagnosis not present

## 2018-01-28 DIAGNOSIS — M25562 Pain in left knee: Secondary | ICD-10-CM | POA: Diagnosis not present

## 2018-01-28 DIAGNOSIS — M6281 Muscle weakness (generalized): Secondary | ICD-10-CM | POA: Diagnosis not present

## 2018-01-28 DIAGNOSIS — R4189 Other symptoms and signs involving cognitive functions and awareness: Secondary | ICD-10-CM | POA: Diagnosis not present

## 2018-01-30 DIAGNOSIS — R2689 Other abnormalities of gait and mobility: Secondary | ICD-10-CM | POA: Diagnosis not present

## 2018-01-30 DIAGNOSIS — N183 Chronic kidney disease, stage 3 (moderate): Secondary | ICD-10-CM | POA: Diagnosis not present

## 2018-01-30 DIAGNOSIS — R278 Other lack of coordination: Secondary | ICD-10-CM | POA: Diagnosis not present

## 2018-01-30 DIAGNOSIS — M6281 Muscle weakness (generalized): Secondary | ICD-10-CM | POA: Diagnosis not present

## 2018-01-30 DIAGNOSIS — R4189 Other symptoms and signs involving cognitive functions and awareness: Secondary | ICD-10-CM | POA: Diagnosis not present

## 2018-01-30 DIAGNOSIS — M25562 Pain in left knee: Secondary | ICD-10-CM | POA: Diagnosis not present

## 2018-01-31 DIAGNOSIS — N183 Chronic kidney disease, stage 3 (moderate): Secondary | ICD-10-CM | POA: Diagnosis not present

## 2018-01-31 DIAGNOSIS — M6281 Muscle weakness (generalized): Secondary | ICD-10-CM | POA: Diagnosis not present

## 2018-01-31 DIAGNOSIS — R278 Other lack of coordination: Secondary | ICD-10-CM | POA: Diagnosis not present

## 2018-01-31 DIAGNOSIS — R2689 Other abnormalities of gait and mobility: Secondary | ICD-10-CM | POA: Diagnosis not present

## 2018-01-31 DIAGNOSIS — R4189 Other symptoms and signs involving cognitive functions and awareness: Secondary | ICD-10-CM | POA: Diagnosis not present

## 2018-01-31 DIAGNOSIS — M25562 Pain in left knee: Secondary | ICD-10-CM | POA: Diagnosis not present

## 2018-02-01 DIAGNOSIS — R2689 Other abnormalities of gait and mobility: Secondary | ICD-10-CM | POA: Diagnosis not present

## 2018-02-01 DIAGNOSIS — M25562 Pain in left knee: Secondary | ICD-10-CM | POA: Diagnosis not present

## 2018-02-01 DIAGNOSIS — R4189 Other symptoms and signs involving cognitive functions and awareness: Secondary | ICD-10-CM | POA: Diagnosis not present

## 2018-02-01 DIAGNOSIS — M6281 Muscle weakness (generalized): Secondary | ICD-10-CM | POA: Diagnosis not present

## 2018-02-01 DIAGNOSIS — N183 Chronic kidney disease, stage 3 (moderate): Secondary | ICD-10-CM | POA: Diagnosis not present

## 2018-02-01 DIAGNOSIS — R278 Other lack of coordination: Secondary | ICD-10-CM | POA: Diagnosis not present

## 2018-02-03 DIAGNOSIS — M25562 Pain in left knee: Secondary | ICD-10-CM | POA: Diagnosis not present

## 2018-02-03 DIAGNOSIS — N183 Chronic kidney disease, stage 3 (moderate): Secondary | ICD-10-CM | POA: Diagnosis not present

## 2018-02-03 DIAGNOSIS — M6281 Muscle weakness (generalized): Secondary | ICD-10-CM | POA: Diagnosis not present

## 2018-02-03 DIAGNOSIS — R4189 Other symptoms and signs involving cognitive functions and awareness: Secondary | ICD-10-CM | POA: Diagnosis not present

## 2018-02-03 DIAGNOSIS — R278 Other lack of coordination: Secondary | ICD-10-CM | POA: Diagnosis not present

## 2018-02-03 DIAGNOSIS — R2689 Other abnormalities of gait and mobility: Secondary | ICD-10-CM | POA: Diagnosis not present

## 2018-02-04 DIAGNOSIS — R278 Other lack of coordination: Secondary | ICD-10-CM | POA: Diagnosis not present

## 2018-02-04 DIAGNOSIS — R2689 Other abnormalities of gait and mobility: Secondary | ICD-10-CM | POA: Diagnosis not present

## 2018-02-04 DIAGNOSIS — R4189 Other symptoms and signs involving cognitive functions and awareness: Secondary | ICD-10-CM | POA: Diagnosis not present

## 2018-02-04 DIAGNOSIS — M6281 Muscle weakness (generalized): Secondary | ICD-10-CM | POA: Diagnosis not present

## 2018-02-04 DIAGNOSIS — N183 Chronic kidney disease, stage 3 (moderate): Secondary | ICD-10-CM | POA: Diagnosis not present

## 2018-02-04 DIAGNOSIS — M25562 Pain in left knee: Secondary | ICD-10-CM | POA: Diagnosis not present

## 2018-02-09 DIAGNOSIS — R278 Other lack of coordination: Secondary | ICD-10-CM | POA: Diagnosis not present

## 2018-02-09 DIAGNOSIS — R4189 Other symptoms and signs involving cognitive functions and awareness: Secondary | ICD-10-CM | POA: Diagnosis not present

## 2018-02-09 DIAGNOSIS — M6281 Muscle weakness (generalized): Secondary | ICD-10-CM | POA: Diagnosis not present

## 2018-02-09 DIAGNOSIS — N183 Chronic kidney disease, stage 3 (moderate): Secondary | ICD-10-CM | POA: Diagnosis not present

## 2018-02-09 DIAGNOSIS — R2689 Other abnormalities of gait and mobility: Secondary | ICD-10-CM | POA: Diagnosis not present

## 2018-02-09 DIAGNOSIS — M25562 Pain in left knee: Secondary | ICD-10-CM | POA: Diagnosis not present

## 2018-02-10 DIAGNOSIS — R2689 Other abnormalities of gait and mobility: Secondary | ICD-10-CM | POA: Diagnosis not present

## 2018-02-10 DIAGNOSIS — R4189 Other symptoms and signs involving cognitive functions and awareness: Secondary | ICD-10-CM | POA: Diagnosis not present

## 2018-02-10 DIAGNOSIS — R278 Other lack of coordination: Secondary | ICD-10-CM | POA: Diagnosis not present

## 2018-02-10 DIAGNOSIS — M6281 Muscle weakness (generalized): Secondary | ICD-10-CM | POA: Diagnosis not present

## 2018-02-10 DIAGNOSIS — M25562 Pain in left knee: Secondary | ICD-10-CM | POA: Diagnosis not present

## 2018-02-10 DIAGNOSIS — N183 Chronic kidney disease, stage 3 (moderate): Secondary | ICD-10-CM | POA: Diagnosis not present

## 2018-02-11 DIAGNOSIS — R4189 Other symptoms and signs involving cognitive functions and awareness: Secondary | ICD-10-CM | POA: Diagnosis not present

## 2018-02-11 DIAGNOSIS — R278 Other lack of coordination: Secondary | ICD-10-CM | POA: Diagnosis not present

## 2018-02-11 DIAGNOSIS — M1710 Unilateral primary osteoarthritis, unspecified knee: Secondary | ICD-10-CM | POA: Diagnosis not present

## 2018-02-11 DIAGNOSIS — F0150 Vascular dementia without behavioral disturbance: Secondary | ICD-10-CM | POA: Diagnosis not present

## 2018-02-11 DIAGNOSIS — Z741 Need for assistance with personal care: Secondary | ICD-10-CM | POA: Diagnosis not present

## 2018-02-14 DIAGNOSIS — I129 Hypertensive chronic kidney disease with stage 1 through stage 4 chronic kidney disease, or unspecified chronic kidney disease: Secondary | ICD-10-CM | POA: Diagnosis not present

## 2018-02-14 DIAGNOSIS — N189 Chronic kidney disease, unspecified: Secondary | ICD-10-CM | POA: Diagnosis not present

## 2018-02-14 DIAGNOSIS — I872 Venous insufficiency (chronic) (peripheral): Secondary | ICD-10-CM | POA: Diagnosis not present

## 2018-02-14 DIAGNOSIS — R600 Localized edema: Secondary | ICD-10-CM | POA: Diagnosis not present

## 2018-02-15 DIAGNOSIS — R278 Other lack of coordination: Secondary | ICD-10-CM | POA: Diagnosis not present

## 2018-02-15 DIAGNOSIS — R4189 Other symptoms and signs involving cognitive functions and awareness: Secondary | ICD-10-CM | POA: Diagnosis not present

## 2018-02-15 DIAGNOSIS — F0150 Vascular dementia without behavioral disturbance: Secondary | ICD-10-CM | POA: Diagnosis not present

## 2018-02-15 DIAGNOSIS — M1710 Unilateral primary osteoarthritis, unspecified knee: Secondary | ICD-10-CM | POA: Diagnosis not present

## 2018-02-15 DIAGNOSIS — Z741 Need for assistance with personal care: Secondary | ICD-10-CM | POA: Diagnosis not present

## 2018-02-18 DIAGNOSIS — E785 Hyperlipidemia, unspecified: Secondary | ICD-10-CM

## 2018-02-18 DIAGNOSIS — N183 Chronic kidney disease, stage 3 (moderate): Secondary | ICD-10-CM

## 2018-02-18 DIAGNOSIS — I10 Essential (primary) hypertension: Secondary | ICD-10-CM

## 2018-02-18 DIAGNOSIS — K219 Gastro-esophageal reflux disease without esophagitis: Secondary | ICD-10-CM

## 2018-02-18 DIAGNOSIS — F0150 Vascular dementia without behavioral disturbance: Secondary | ICD-10-CM

## 2018-02-18 DIAGNOSIS — K5900 Constipation, unspecified: Secondary | ICD-10-CM

## 2018-02-18 DIAGNOSIS — M1990 Unspecified osteoarthritis, unspecified site: Secondary | ICD-10-CM

## 2018-03-16 DIAGNOSIS — M1990 Unspecified osteoarthritis, unspecified site: Secondary | ICD-10-CM | POA: Diagnosis not present

## 2018-03-16 DIAGNOSIS — F0150 Vascular dementia without behavioral disturbance: Secondary | ICD-10-CM

## 2018-03-16 DIAGNOSIS — F310 Bipolar disorder, current episode hypomanic: Secondary | ICD-10-CM | POA: Diagnosis not present

## 2018-03-16 DIAGNOSIS — I872 Venous insufficiency (chronic) (peripheral): Secondary | ICD-10-CM | POA: Diagnosis not present

## 2018-03-16 DIAGNOSIS — I10 Essential (primary) hypertension: Secondary | ICD-10-CM | POA: Diagnosis not present

## 2018-05-03 DIAGNOSIS — Z85828 Personal history of other malignant neoplasm of skin: Secondary | ICD-10-CM | POA: Diagnosis not present

## 2018-05-03 DIAGNOSIS — Z08 Encounter for follow-up examination after completed treatment for malignant neoplasm: Secondary | ICD-10-CM | POA: Diagnosis not present

## 2018-05-03 DIAGNOSIS — D485 Neoplasm of uncertain behavior of skin: Secondary | ICD-10-CM | POA: Diagnosis not present

## 2018-05-03 DIAGNOSIS — X32XXXA Exposure to sunlight, initial encounter: Secondary | ICD-10-CM | POA: Diagnosis not present

## 2018-05-03 DIAGNOSIS — L570 Actinic keratosis: Secondary | ICD-10-CM | POA: Diagnosis not present

## 2018-05-03 DIAGNOSIS — C44319 Basal cell carcinoma of skin of other parts of face: Secondary | ICD-10-CM | POA: Diagnosis not present

## 2018-05-19 DIAGNOSIS — I872 Venous insufficiency (chronic) (peripheral): Secondary | ICD-10-CM | POA: Diagnosis not present

## 2018-05-19 DIAGNOSIS — M159 Polyosteoarthritis, unspecified: Secondary | ICD-10-CM | POA: Diagnosis not present

## 2018-05-19 DIAGNOSIS — F0150 Vascular dementia without behavioral disturbance: Secondary | ICD-10-CM | POA: Diagnosis not present

## 2018-05-19 DIAGNOSIS — I10 Essential (primary) hypertension: Secondary | ICD-10-CM | POA: Diagnosis not present

## 2018-07-01 DIAGNOSIS — R262 Difficulty in walking, not elsewhere classified: Secondary | ICD-10-CM | POA: Diagnosis not present

## 2018-07-01 DIAGNOSIS — N183 Chronic kidney disease, stage 3 (moderate): Secondary | ICD-10-CM | POA: Diagnosis not present

## 2018-07-04 DIAGNOSIS — N183 Chronic kidney disease, stage 3 (moderate): Secondary | ICD-10-CM | POA: Diagnosis not present

## 2018-07-04 DIAGNOSIS — R262 Difficulty in walking, not elsewhere classified: Secondary | ICD-10-CM | POA: Diagnosis not present

## 2018-07-05 DIAGNOSIS — R262 Difficulty in walking, not elsewhere classified: Secondary | ICD-10-CM | POA: Diagnosis not present

## 2018-07-05 DIAGNOSIS — N183 Chronic kidney disease, stage 3 (moderate): Secondary | ICD-10-CM | POA: Diagnosis not present

## 2018-07-06 DIAGNOSIS — R262 Difficulty in walking, not elsewhere classified: Secondary | ICD-10-CM | POA: Diagnosis not present

## 2018-07-06 DIAGNOSIS — N183 Chronic kidney disease, stage 3 (moderate): Secondary | ICD-10-CM | POA: Diagnosis not present

## 2018-07-07 DIAGNOSIS — N183 Chronic kidney disease, stage 3 (moderate): Secondary | ICD-10-CM | POA: Diagnosis not present

## 2018-07-07 DIAGNOSIS — R262 Difficulty in walking, not elsewhere classified: Secondary | ICD-10-CM | POA: Diagnosis not present

## 2018-07-08 DIAGNOSIS — N183 Chronic kidney disease, stage 3 (moderate): Secondary | ICD-10-CM | POA: Diagnosis not present

## 2018-07-08 DIAGNOSIS — R262 Difficulty in walking, not elsewhere classified: Secondary | ICD-10-CM | POA: Diagnosis not present

## 2018-07-11 DIAGNOSIS — N183 Chronic kidney disease, stage 3 (moderate): Secondary | ICD-10-CM | POA: Diagnosis not present

## 2018-07-11 DIAGNOSIS — R262 Difficulty in walking, not elsewhere classified: Secondary | ICD-10-CM | POA: Diagnosis not present

## 2018-07-12 DIAGNOSIS — R262 Difficulty in walking, not elsewhere classified: Secondary | ICD-10-CM | POA: Diagnosis not present

## 2018-07-12 DIAGNOSIS — N183 Chronic kidney disease, stage 3 (moderate): Secondary | ICD-10-CM | POA: Diagnosis not present

## 2018-07-13 DIAGNOSIS — M1990 Unspecified osteoarthritis, unspecified site: Secondary | ICD-10-CM | POA: Diagnosis not present

## 2018-07-13 DIAGNOSIS — N183 Chronic kidney disease, stage 3 (moderate): Secondary | ICD-10-CM | POA: Diagnosis not present

## 2018-07-13 DIAGNOSIS — E1121 Type 2 diabetes mellitus with diabetic nephropathy: Secondary | ICD-10-CM

## 2018-07-13 DIAGNOSIS — I872 Venous insufficiency (chronic) (peripheral): Secondary | ICD-10-CM | POA: Diagnosis not present

## 2018-07-13 DIAGNOSIS — I10 Essential (primary) hypertension: Secondary | ICD-10-CM | POA: Diagnosis not present

## 2018-07-13 DIAGNOSIS — R262 Difficulty in walking, not elsewhere classified: Secondary | ICD-10-CM | POA: Diagnosis not present

## 2018-07-13 DIAGNOSIS — F0150 Vascular dementia without behavioral disturbance: Secondary | ICD-10-CM | POA: Diagnosis not present

## 2018-07-14 DIAGNOSIS — F0150 Vascular dementia without behavioral disturbance: Secondary | ICD-10-CM | POA: Diagnosis not present

## 2018-07-14 DIAGNOSIS — N183 Chronic kidney disease, stage 3 (moderate): Secondary | ICD-10-CM | POA: Diagnosis not present

## 2018-07-14 DIAGNOSIS — R262 Difficulty in walking, not elsewhere classified: Secondary | ICD-10-CM | POA: Diagnosis not present

## 2018-07-15 DIAGNOSIS — N183 Chronic kidney disease, stage 3 (moderate): Secondary | ICD-10-CM | POA: Diagnosis not present

## 2018-07-15 DIAGNOSIS — R262 Difficulty in walking, not elsewhere classified: Secondary | ICD-10-CM | POA: Diagnosis not present

## 2018-07-15 DIAGNOSIS — F0150 Vascular dementia without behavioral disturbance: Secondary | ICD-10-CM | POA: Diagnosis not present

## 2018-07-18 DIAGNOSIS — R262 Difficulty in walking, not elsewhere classified: Secondary | ICD-10-CM | POA: Diagnosis not present

## 2018-07-18 DIAGNOSIS — F0150 Vascular dementia without behavioral disturbance: Secondary | ICD-10-CM | POA: Diagnosis not present

## 2018-07-18 DIAGNOSIS — N183 Chronic kidney disease, stage 3 (moderate): Secondary | ICD-10-CM | POA: Diagnosis not present

## 2018-07-19 DIAGNOSIS — F0150 Vascular dementia without behavioral disturbance: Secondary | ICD-10-CM | POA: Diagnosis not present

## 2018-07-19 DIAGNOSIS — N183 Chronic kidney disease, stage 3 (moderate): Secondary | ICD-10-CM | POA: Diagnosis not present

## 2018-07-19 DIAGNOSIS — R262 Difficulty in walking, not elsewhere classified: Secondary | ICD-10-CM | POA: Diagnosis not present

## 2018-07-20 DIAGNOSIS — F0150 Vascular dementia without behavioral disturbance: Secondary | ICD-10-CM | POA: Diagnosis not present

## 2018-07-20 DIAGNOSIS — R262 Difficulty in walking, not elsewhere classified: Secondary | ICD-10-CM | POA: Diagnosis not present

## 2018-07-20 DIAGNOSIS — N183 Chronic kidney disease, stage 3 (moderate): Secondary | ICD-10-CM | POA: Diagnosis not present

## 2018-07-21 DIAGNOSIS — N183 Chronic kidney disease, stage 3 (moderate): Secondary | ICD-10-CM | POA: Diagnosis not present

## 2018-07-21 DIAGNOSIS — F0150 Vascular dementia without behavioral disturbance: Secondary | ICD-10-CM | POA: Diagnosis not present

## 2018-07-21 DIAGNOSIS — R262 Difficulty in walking, not elsewhere classified: Secondary | ICD-10-CM | POA: Diagnosis not present

## 2018-07-22 DIAGNOSIS — F0150 Vascular dementia without behavioral disturbance: Secondary | ICD-10-CM | POA: Diagnosis not present

## 2018-07-22 DIAGNOSIS — N183 Chronic kidney disease, stage 3 (moderate): Secondary | ICD-10-CM | POA: Diagnosis not present

## 2018-07-22 DIAGNOSIS — R262 Difficulty in walking, not elsewhere classified: Secondary | ICD-10-CM | POA: Diagnosis not present

## 2018-07-25 DIAGNOSIS — N183 Chronic kidney disease, stage 3 (moderate): Secondary | ICD-10-CM | POA: Diagnosis not present

## 2018-07-25 DIAGNOSIS — F0150 Vascular dementia without behavioral disturbance: Secondary | ICD-10-CM | POA: Diagnosis not present

## 2018-07-25 DIAGNOSIS — R262 Difficulty in walking, not elsewhere classified: Secondary | ICD-10-CM | POA: Diagnosis not present

## 2018-07-26 DIAGNOSIS — F0150 Vascular dementia without behavioral disturbance: Secondary | ICD-10-CM | POA: Diagnosis not present

## 2018-07-26 DIAGNOSIS — N183 Chronic kidney disease, stage 3 (moderate): Secondary | ICD-10-CM | POA: Diagnosis not present

## 2018-07-26 DIAGNOSIS — R262 Difficulty in walking, not elsewhere classified: Secondary | ICD-10-CM | POA: Diagnosis not present

## 2018-07-27 DIAGNOSIS — R262 Difficulty in walking, not elsewhere classified: Secondary | ICD-10-CM | POA: Diagnosis not present

## 2018-07-27 DIAGNOSIS — N183 Chronic kidney disease, stage 3 (moderate): Secondary | ICD-10-CM | POA: Diagnosis not present

## 2018-07-27 DIAGNOSIS — F0150 Vascular dementia without behavioral disturbance: Secondary | ICD-10-CM | POA: Diagnosis not present

## 2018-07-28 DIAGNOSIS — N183 Chronic kidney disease, stage 3 (moderate): Secondary | ICD-10-CM | POA: Diagnosis not present

## 2018-07-28 DIAGNOSIS — R262 Difficulty in walking, not elsewhere classified: Secondary | ICD-10-CM | POA: Diagnosis not present

## 2018-07-28 DIAGNOSIS — F0150 Vascular dementia without behavioral disturbance: Secondary | ICD-10-CM | POA: Diagnosis not present

## 2018-09-19 DIAGNOSIS — F0150 Vascular dementia without behavioral disturbance: Secondary | ICD-10-CM | POA: Diagnosis not present

## 2018-09-19 DIAGNOSIS — M159 Polyosteoarthritis, unspecified: Secondary | ICD-10-CM | POA: Diagnosis not present

## 2018-09-19 DIAGNOSIS — I872 Venous insufficiency (chronic) (peripheral): Secondary | ICD-10-CM | POA: Diagnosis not present

## 2018-09-19 DIAGNOSIS — I10 Essential (primary) hypertension: Secondary | ICD-10-CM | POA: Diagnosis not present

## 2018-11-08 DIAGNOSIS — B342 Coronavirus infection, unspecified: Secondary | ICD-10-CM | POA: Diagnosis not present

## 2018-11-11 ENCOUNTER — Other Ambulatory Visit: Payer: Self-pay

## 2018-12-13 DIAGNOSIS — R2681 Unsteadiness on feet: Secondary | ICD-10-CM | POA: Diagnosis not present

## 2018-12-13 DIAGNOSIS — M25562 Pain in left knee: Secondary | ICD-10-CM | POA: Diagnosis not present

## 2018-12-13 DIAGNOSIS — N183 Chronic kidney disease, stage 3 (moderate): Secondary | ICD-10-CM | POA: Diagnosis not present

## 2018-12-13 DIAGNOSIS — R262 Difficulty in walking, not elsewhere classified: Secondary | ICD-10-CM | POA: Diagnosis not present

## 2018-12-13 DIAGNOSIS — F0151 Vascular dementia with behavioral disturbance: Secondary | ICD-10-CM | POA: Diagnosis not present

## 2018-12-13 DIAGNOSIS — M6281 Muscle weakness (generalized): Secondary | ICD-10-CM | POA: Diagnosis not present

## 2018-12-14 DIAGNOSIS — N183 Chronic kidney disease, stage 3 (moderate): Secondary | ICD-10-CM | POA: Diagnosis not present

## 2018-12-14 DIAGNOSIS — F0151 Vascular dementia with behavioral disturbance: Secondary | ICD-10-CM | POA: Diagnosis not present

## 2018-12-14 DIAGNOSIS — R262 Difficulty in walking, not elsewhere classified: Secondary | ICD-10-CM | POA: Diagnosis not present

## 2018-12-14 DIAGNOSIS — M25562 Pain in left knee: Secondary | ICD-10-CM | POA: Diagnosis not present

## 2018-12-14 DIAGNOSIS — M6281 Muscle weakness (generalized): Secondary | ICD-10-CM | POA: Diagnosis not present

## 2018-12-14 DIAGNOSIS — R2681 Unsteadiness on feet: Secondary | ICD-10-CM | POA: Diagnosis not present

## 2018-12-15 DIAGNOSIS — N183 Chronic kidney disease, stage 3 (moderate): Secondary | ICD-10-CM | POA: Diagnosis not present

## 2018-12-15 DIAGNOSIS — M25562 Pain in left knee: Secondary | ICD-10-CM | POA: Diagnosis not present

## 2018-12-15 DIAGNOSIS — R262 Difficulty in walking, not elsewhere classified: Secondary | ICD-10-CM | POA: Diagnosis not present

## 2018-12-15 DIAGNOSIS — R2681 Unsteadiness on feet: Secondary | ICD-10-CM | POA: Diagnosis not present

## 2018-12-15 DIAGNOSIS — M6281 Muscle weakness (generalized): Secondary | ICD-10-CM | POA: Diagnosis not present

## 2018-12-15 DIAGNOSIS — F0151 Vascular dementia with behavioral disturbance: Secondary | ICD-10-CM | POA: Diagnosis not present

## 2018-12-22 DIAGNOSIS — Z03818 Encounter for observation for suspected exposure to other biological agents ruled out: Secondary | ICD-10-CM | POA: Diagnosis not present

## 2018-12-26 DIAGNOSIS — M159 Polyosteoarthritis, unspecified: Secondary | ICD-10-CM | POA: Diagnosis not present

## 2018-12-26 DIAGNOSIS — I872 Venous insufficiency (chronic) (peripheral): Secondary | ICD-10-CM | POA: Diagnosis not present

## 2018-12-26 DIAGNOSIS — F0150 Vascular dementia without behavioral disturbance: Secondary | ICD-10-CM | POA: Diagnosis not present

## 2018-12-26 DIAGNOSIS — I10 Essential (primary) hypertension: Secondary | ICD-10-CM | POA: Diagnosis not present

## 2019-01-13 DIAGNOSIS — Z03818 Encounter for observation for suspected exposure to other biological agents ruled out: Secondary | ICD-10-CM | POA: Diagnosis not present

## 2019-01-18 DIAGNOSIS — I10 Essential (primary) hypertension: Secondary | ICD-10-CM | POA: Diagnosis not present

## 2019-01-18 DIAGNOSIS — M1990 Unspecified osteoarthritis, unspecified site: Secondary | ICD-10-CM | POA: Diagnosis not present

## 2019-01-18 DIAGNOSIS — I83209 Varicose veins of unspecified lower extremity with both ulcer of unspecified site and inflammation: Secondary | ICD-10-CM | POA: Diagnosis not present

## 2019-01-18 DIAGNOSIS — F0150 Vascular dementia without behavioral disturbance: Secondary | ICD-10-CM | POA: Diagnosis not present

## 2019-01-24 DIAGNOSIS — Z03818 Encounter for observation for suspected exposure to other biological agents ruled out: Secondary | ICD-10-CM | POA: Diagnosis not present

## 2019-01-27 DIAGNOSIS — Z03818 Encounter for observation for suspected exposure to other biological agents ruled out: Secondary | ICD-10-CM | POA: Diagnosis not present

## 2019-01-31 DIAGNOSIS — Z03818 Encounter for observation for suspected exposure to other biological agents ruled out: Secondary | ICD-10-CM | POA: Diagnosis not present

## 2019-02-08 DIAGNOSIS — Z03818 Encounter for observation for suspected exposure to other biological agents ruled out: Secondary | ICD-10-CM | POA: Diagnosis not present

## 2019-02-16 ENCOUNTER — Telehealth: Payer: Self-pay | Admitting: *Deleted

## 2019-02-16 ENCOUNTER — Encounter (HOSPITAL_COMMUNITY): Payer: Self-pay

## 2019-02-16 ENCOUNTER — Inpatient Hospital Stay (HOSPITAL_COMMUNITY)
Admission: AD | Admit: 2019-02-16 | Discharge: 2019-02-21 | DRG: 177 | Disposition: A | Discharge status: Hospice | Payer: Medicare Other | Source: Skilled Nursing Facility | Attending: Internal Medicine | Admitting: Internal Medicine

## 2019-02-16 ENCOUNTER — Inpatient Hospital Stay (HOSPITAL_COMMUNITY): Payer: Medicare Other

## 2019-02-16 DIAGNOSIS — Z7982 Long term (current) use of aspirin: Secondary | ICD-10-CM | POA: Diagnosis not present

## 2019-02-16 DIAGNOSIS — N183 Chronic kidney disease, stage 3 unspecified: Secondary | ICD-10-CM | POA: Diagnosis present

## 2019-02-16 DIAGNOSIS — J9601 Acute respiratory failure with hypoxia: Secondary | ICD-10-CM | POA: Diagnosis present

## 2019-02-16 DIAGNOSIS — I10 Essential (primary) hypertension: Secondary | ICD-10-CM | POA: Diagnosis present

## 2019-02-16 DIAGNOSIS — Z515 Encounter for palliative care: Secondary | ICD-10-CM | POA: Diagnosis not present

## 2019-02-16 DIAGNOSIS — E876 Hypokalemia: Secondary | ICD-10-CM | POA: Diagnosis present

## 2019-02-16 DIAGNOSIS — Z96651 Presence of right artificial knee joint: Secondary | ICD-10-CM | POA: Diagnosis present

## 2019-02-16 DIAGNOSIS — R0602 Shortness of breath: Secondary | ICD-10-CM

## 2019-02-16 DIAGNOSIS — J1289 Other viral pneumonia: Secondary | ICD-10-CM | POA: Diagnosis present

## 2019-02-16 DIAGNOSIS — K219 Gastro-esophageal reflux disease without esophagitis: Secondary | ICD-10-CM | POA: Diagnosis present

## 2019-02-16 DIAGNOSIS — Z8673 Personal history of transient ischemic attack (TIA), and cerebral infarction without residual deficits: Secondary | ICD-10-CM | POA: Diagnosis not present

## 2019-02-16 DIAGNOSIS — I679 Cerebrovascular disease, unspecified: Secondary | ICD-10-CM | POA: Diagnosis not present

## 2019-02-16 DIAGNOSIS — R109 Unspecified abdominal pain: Secondary | ICD-10-CM

## 2019-02-16 DIAGNOSIS — Z9981 Dependence on supplemental oxygen: Secondary | ICD-10-CM | POA: Diagnosis not present

## 2019-02-16 DIAGNOSIS — I129 Hypertensive chronic kidney disease with stage 1 through stage 4 chronic kidney disease, or unspecified chronic kidney disease: Secondary | ICD-10-CM | POA: Diagnosis present

## 2019-02-16 DIAGNOSIS — U071 COVID-19: Secondary | ICD-10-CM | POA: Diagnosis present

## 2019-02-16 DIAGNOSIS — B9729 Other coronavirus as the cause of diseases classified elsewhere: Secondary | ICD-10-CM | POA: Diagnosis not present

## 2019-02-16 DIAGNOSIS — E785 Hyperlipidemia, unspecified: Secondary | ICD-10-CM | POA: Diagnosis present

## 2019-02-16 DIAGNOSIS — R52 Pain, unspecified: Secondary | ICD-10-CM | POA: Diagnosis not present

## 2019-02-16 DIAGNOSIS — J9600 Acute respiratory failure, unspecified whether with hypoxia or hypercapnia: Secondary | ICD-10-CM | POA: Diagnosis present

## 2019-02-16 DIAGNOSIS — Z79899 Other long term (current) drug therapy: Secondary | ICD-10-CM | POA: Diagnosis not present

## 2019-02-16 DIAGNOSIS — N1831 Chronic kidney disease, stage 3a: Secondary | ICD-10-CM | POA: Diagnosis not present

## 2019-02-16 DIAGNOSIS — Z436 Encounter for attention to other artificial openings of urinary tract: Secondary | ICD-10-CM | POA: Diagnosis not present

## 2019-02-16 DIAGNOSIS — Z7401 Bed confinement status: Secondary | ICD-10-CM | POA: Diagnosis not present

## 2019-02-16 DIAGNOSIS — J1282 Pneumonia due to coronavirus disease 2019: Secondary | ICD-10-CM | POA: Diagnosis present

## 2019-02-16 DIAGNOSIS — Z66 Do not resuscitate: Secondary | ICD-10-CM | POA: Diagnosis present

## 2019-02-16 DIAGNOSIS — F0150 Vascular dementia without behavioral disturbance: Secondary | ICD-10-CM | POA: Diagnosis present

## 2019-02-16 DIAGNOSIS — Z87891 Personal history of nicotine dependence: Secondary | ICD-10-CM | POA: Diagnosis not present

## 2019-02-16 DIAGNOSIS — N1832 Chronic kidney disease, stage 3b: Secondary | ICD-10-CM | POA: Diagnosis present

## 2019-02-16 DIAGNOSIS — M2550 Pain in unspecified joint: Secondary | ICD-10-CM | POA: Diagnosis not present

## 2019-02-16 DIAGNOSIS — R4182 Altered mental status, unspecified: Secondary | ICD-10-CM | POA: Diagnosis not present

## 2019-02-16 LAB — COMPREHENSIVE METABOLIC PANEL
ALT: 30 U/L (ref 0–44)
AST: 73 U/L — ABNORMAL HIGH (ref 15–41)
Albumin: 3.2 g/dL — ABNORMAL LOW (ref 3.5–5.0)
Alkaline Phosphatase: 54 U/L (ref 38–126)
Anion gap: 11 (ref 5–15)
BUN: 49 mg/dL — ABNORMAL HIGH (ref 8–23)
CO2: 21 mmol/L — ABNORMAL LOW (ref 22–32)
Calcium: 8 mg/dL — ABNORMAL LOW (ref 8.9–10.3)
Chloride: 103 mmol/L (ref 98–111)
Creatinine, Ser: 1.54 mg/dL — ABNORMAL HIGH (ref 0.44–1.00)
GFR calc Af Amer: 32 mL/min — ABNORMAL LOW (ref >60)
GFR calc non Af Amer: 28 mL/min — ABNORMAL LOW (ref >60)
Glucose, Bld: 120 mg/dL — ABNORMAL HIGH (ref 70–99)
Potassium: 3 mmol/L — ABNORMAL LOW (ref 3.5–5.1)
Sodium: 135 mmol/L (ref 135–145)
Total Bilirubin: 0.6 mg/dL (ref 0.3–1.2)
Total Protein: 6.7 g/dL (ref 6.5–8.1)

## 2019-02-16 LAB — FERRITIN: Ferritin: 186 ng/mL (ref 11–307)

## 2019-02-16 LAB — CBC WITH DIFFERENTIAL/PLATELET
Abs Immature Granulocytes: 0.14 10*3/uL — ABNORMAL HIGH (ref 0.00–0.07)
Basophils Absolute: 0 10*3/uL (ref 0.0–0.1)
Basophils Relative: 1 %
Eosinophils Absolute: 0 10*3/uL (ref 0.0–0.5)
Eosinophils Relative: 0 %
HCT: 41.5 % (ref 36.0–46.0)
Hemoglobin: 13.3 g/dL (ref 12.0–15.0)
Immature Granulocytes: 3 %
Lymphocytes Relative: 17 %
Lymphs Abs: 1 10*3/uL (ref 0.7–4.0)
MCH: 30.5 pg (ref 26.0–34.0)
MCHC: 32 g/dL (ref 30.0–36.0)
MCV: 95.2 fL (ref 80.0–100.0)
Monocytes Absolute: 1.7 10*3/uL — ABNORMAL HIGH (ref 0.1–1.0)
Monocytes Relative: 32 %
Neutro Abs: 2.6 10*3/uL (ref 1.7–7.7)
Neutrophils Relative %: 47 %
Platelets: 145 10*3/uL — ABNORMAL LOW (ref 150–400)
RBC: 4.36 MIL/uL (ref 3.87–5.11)
RDW: 13.5 % (ref 11.5–15.5)
WBC: 5.5 10*3/uL (ref 4.0–10.5)
nRBC: 0 % (ref 0.0–0.2)

## 2019-02-16 LAB — TROPONIN I (HIGH SENSITIVITY)
Troponin I (High Sensitivity): 47 ng/L — ABNORMAL HIGH (ref <18)
Troponin I (High Sensitivity): 45 ng/L — ABNORMAL HIGH (ref <18)

## 2019-02-16 LAB — PROCALCITONIN: Procalcitonin: <0.1 ng/mL

## 2019-02-16 LAB — D-DIMER, QUANTITATIVE: D-Dimer, Quant: 2.15 ug/mL-FEU — ABNORMAL HIGH (ref 0.00–0.50)

## 2019-02-16 LAB — BRAIN NATRIURETIC PEPTIDE: B Natriuretic Peptide: 66.3 pg/mL (ref 0.0–100.0)

## 2019-02-16 LAB — C-REACTIVE PROTEIN: CRP: 7 mg/dL — ABNORMAL HIGH (ref <1.0)

## 2019-02-16 LAB — ABO/RH: ABO/RH(D): O POS

## 2019-02-16 MED ORDER — VITAMIN C 500 MG PO TABS
500.0000 mg | ORAL_TABLET | Freq: Every day | ORAL | Status: DC
  Administered 2019-02-16 – 2019-02-19 (×3): 500 mg via ORAL
  Filled 2019-02-16 (×4): qty 1

## 2019-02-16 MED ORDER — ONDANSETRON HCL 4 MG/2ML IJ SOLN: 4.0000 mg | Freq: Four times a day (QID) | INTRAMUSCULAR | Status: DC | PRN

## 2019-02-16 MED ORDER — ENOXAPARIN SODIUM 40 MG/0.4ML ~~LOC~~ SOLN
40.0000 mg | SUBCUTANEOUS | Status: DC
  Administered 2019-02-16: 40 mg via SUBCUTANEOUS
  Filled 2019-02-16: qty 0.4

## 2019-02-16 MED ORDER — SODIUM CHLORIDE 0.9 % IV SOLN
200.0000 mg | Freq: Once | INTRAVENOUS | Status: AC
  Administered 2019-02-17: 200 mg via INTRAVENOUS
  Filled 2019-02-16: qty 40

## 2019-02-16 MED ORDER — ACETAMINOPHEN 325 MG PO TABS
650.0000 mg | ORAL_TABLET | Freq: Four times a day (QID) | ORAL | Status: DC | PRN
  Filled 2019-02-16: qty 2

## 2019-02-16 MED ORDER — SODIUM CHLORIDE 0.9% FLUSH
3.0000 mL | Freq: Two times a day (BID) | INTRAVENOUS | Status: DC
  Administered 2019-02-16 – 2019-02-18 (×4): 3 mL via INTRAVENOUS

## 2019-02-16 MED ORDER — ZINC SULFATE 220 (50 ZN) MG PO CAPS
220.0000 mg | ORAL_CAPSULE | Freq: Every day | ORAL | Status: DC
  Administered 2019-02-16 – 2019-02-19 (×3): 220 mg via ORAL
  Filled 2019-02-16 (×4): qty 1

## 2019-02-16 MED ORDER — DEXAMETHASONE SODIUM PHOSPHATE 10 MG/ML IJ SOLN
6.0000 mg | INTRAMUSCULAR | Status: DC
  Administered 2019-02-16 – 2019-02-19 (×4): 6 mg via INTRAVENOUS
  Filled 2019-02-16 (×4): qty 1

## 2019-02-16 MED ORDER — GUAIFENESIN-DM 100-10 MG/5ML PO SYRP: 10.0000 mL | ORAL_SOLUTION | ORAL | Status: DC | PRN

## 2019-02-16 MED ORDER — SODIUM CHLORIDE 0.9 % IV SOLN: 250.0000 mL | INTRAVENOUS | Status: DC | PRN

## 2019-02-16 MED ORDER — ONDANSETRON HCL 4 MG PO TABS: 4.0000 mg | ORAL_TABLET | Freq: Four times a day (QID) | ORAL | Status: DC | PRN

## 2019-02-16 MED ORDER — SODIUM CHLORIDE 0.9% FLUSH: 3.0000 mL | INTRAVENOUS | Status: DC | PRN

## 2019-02-16 MED ORDER — SODIUM CHLORIDE 0.9 % IV SOLN
100.0000 mg | INTRAVENOUS | Status: DC
  Administered 2019-02-18 – 2019-02-19 (×2): 100 mg via INTRAVENOUS
  Filled 2019-02-16 (×3): qty 20

## 2019-02-16 NOTE — Progress Notes (Signed)
Pharmacy Note - Remdesivir Dosing  O:  ALT: 30 CXR:  Patchy bibasilar opacities/infiltrates Requiring supplemental O2:    A/P:  Patient meets criteria for remdesivir.  Begin remdesivir 200 mg IV x 1, followed by 100 mg IV daily x 4 days  Monitor ALT, clinical progress  Despina Pole, Pharm. D. Clinical Pharmacist 02/16/2019 11:20 PM

## 2019-02-16 NOTE — Progress Notes (Addendum)
Pt arrived to room via EMS. Pt has durable DNR in paperwork. Dr Waldron Labs notified via epic chat of pts arrival. Pt alert to self, moaning but unable to articulate if something hurts. Currently on 2L  Paged admitting pager via [place]. Placed pt on tele until further orders written.  Edit: Pt placed on tele monitor and afib noted. Checking EKG at this time.  Night MD Dr Shanon Brow paged via [place]. EKG placed in pt's chart.

## 2019-02-16 NOTE — H&P (Signed)
History and Physical    Tracy Novak [id number] DOB: Aug 16, 1919 DOA: 02/16/2019  PCP: Crecencio Mc, MD  Patient coming from: snf  Chief Complaint:  sob  HPI: Tracy Novak is a 85 y.o. female with medical history significant of dementia, htn comes in with worsening sob tested pos covid 10/28.  86% on RA.  cxr with pna.     Review of Systems:  Unobtainable due to dementia  Past Medical History:  Diagnosis Date  . CVA (cerebral vascular accident) ([place]) 11/2004  . Diverticulosis   . GERD (gastroesophageal reflux disease)   . History of colon polyps   . Hyperlipidemia   . Hypertension   . MVP (mitral valve prolapse)    Echo- negative 11/04  . Obstruction of bowel ([place]) 3/09   Laparatomy and enterolysis- Dr. Rochel Brome  . Osteoarthritis   . TIA (transient ischemic attack)    hospitalized 12/02, brain MRI 12/02    Past Surgical History:  Procedure Laterality Date  . APPENDECTOMY    . CATARACT EXTRACTION  2000   with IOL  . REPLACEMENT TOTAL KNEE  1999   right  . ROTATOR CUFF REPAIR     right     reports that she has quit smoking. She has never used smokeless tobacco. She reports that she does not use drugs. No history on file for alcohol.  Allergies  Allergen Reactions  . Cephalexin Rash    " peeled my skin off" per patient  . Haloperidol Lactate Other (See Comments)    REACTION: unspecified  . Hydrocodone-Acetaminophen     REACTION: nausea  . Penicillins     REACTION: unspecified  . Sulfonamide Derivatives     REACTION: unspecified    Family History  Problem Relation Age of Onset  . Alzheimer's disease Mother   . Diabetes Mother   . Diabetes Sister   . Coronary artery disease Brother        CABG x 5  . Hypertension Neg Hx   . Cancer Neg Hx        no breast or colon cancer  . Diabetes Brother     Prior to Admission medications   Medication Sig Start Date End Date Taking? Authorizing Provider  amLODipine (NORVASC) 10 MG tablet Take 1 tablet (10  mg total) by mouth daily. 08/05/16   Crecencio Mc, MD  aspirin 81 MG tablet Take 81 mg by mouth daily.     [provider]  furosemide (LASIX) 20 MG tablet Take 1 tablet (20 mg total) by mouth every other day. 07/12/17   Crecencio Mc, MD    Physical Exam: Vitals:   02/16/19 1843 02/16/19 1930  BP: (!) 117/97 126/61  Pulse: 96 92  Resp: (!) 25 18  Temp: 97.9 F (36.6 C) 97.6 F (36.4 C)  TempSrc: Oral Axillary  SpO2: 98% 98%      Constitutional: NAD, calm, comfortable Vitals:   02/16/19 1843 02/16/19 1930  BP: (!) 117/97 126/61  Pulse: 96 92  Resp: (!) 25 18  Temp: 97.9 F (36.6 C) 97.6 F (36.4 C)  TempSrc: Oral Axillary  SpO2: 98% 98%   Eyes: PERRL, lids and conjunctivae normal ENMT: Mucous membranes are moist. Posterior pharynx clear of any exudate or lesions.Normal dentition.  Neck: normal, supple, no masses, no thyromegaly Respiratory: clear to auscultation bilaterally, no wheezing, no crackles. Normal respiratory effort. No accessory muscle use.  Cardiovascular: Regular rate and rhythm, no murmurs / rubs / gallops.  No extremity edema. 2+ pedal pulses. No carotid bruits.  Abdomen: no tenderness, no masses palpated. No hepatosplenomegaly. Bowel sounds positive.  Musculoskeletal: no clubbing / cyanosis. No joint deformity upper and lower extremities. Good ROM, no contractures. Normal muscle tone.  Skin: no rashes, lesions, ulcers. No induration Neurologic: CN 2-12 grossly intact. dementia Psychiatric: no agitation   Labs on Admission: I have personally reviewed following labs and imaging studies  CBC: Recent Labs  Lab 02/16/19 1850  WBC 5.5  NEUTROABS 2.6  HGB 13.3  HCT 41.5  MCV 95.2  PLT [id number]   Basic Metabolic Panel: No results for input(s): NA, K, CL, CO2, GLUCOSE, BUN, CREATININE, CALCIUM, MG, PHOS in the last 168 hours. GFR: CrCl cannot be calculated (Patient's most recent lab result is older than the maximum 21 days allowed.). Liver  Function Tests: No results for input(s): AST, ALT, ALKPHOS, BILITOT, PROT, ALBUMIN in the last 168 hours. No results for input(s): LIPASE, AMYLASE in the last 168 hours. No results for input(s): AMMONIA in the last 168 hours. Coagulation Profile: No results for input(s): INR, PROTIME in the last 168 hours. Cardiac Enzymes: No results for input(s): CKTOTAL, CKMB, CKMBINDEX, TROPONINI in the last 168 hours. BNP (last 3 results) No results for input(s): PROBNP in the last 8760 hours. HbA1C: No results for input(s): HGBA1C in the last 72 hours. CBG: No results for input(s): GLUCAP in the last 168 hours. Lipid Profile: No results for input(s): CHOL, HDL, LDLCALC, TRIG, CHOLHDL, LDLDIRECT in the last 72 hours. Thyroid Function Tests: No results for input(s): TSH, T4TOTAL, FREET4, T3FREE, THYROIDAB in the last 72 hours. Anemia Panel: No results for input(s): VITAMINB12, FOLATE, FERRITIN, TIBC, IRON, RETICCTPCT in the last 72 hours. Urine analysis:    Component Value Date/Time   COLORURINE YELLOW 12/13/2015 1438   APPEARANCEUR Cloudy (A) 12/13/2015 1438   LABSPEC 1.020 12/13/2015 1438   PHURINE 5.5 12/13/2015 1438   GLUCOSEU NEGATIVE 12/13/2015 1438   HGBUR TRACE-INTACT (A) 12/13/2015 1438   BILIRUBINUR Small 12/13/2015 1439   KETONESUR 15 (A) 12/13/2015 1438   PROTEINUR 30 12/13/2015 1439   UROBILINOGEN 0.2 12/13/2015 1439   UROBILINOGEN 0.2 12/13/2015 1438   NITRITE Negative 12/13/2015 1439   NITRITE NEGATIVE 12/13/2015 1438   LEUKOCYTESUR small (1+) (A) 12/13/2015 1439   Sepsis Labs: !!!!!!!!!!!!!!!!!!!!!!!!!!!!!!!!!!!!!!!!!!!! @LABRCNTIP (procalcitonin:4,lacticidven:4) )No results found for this or any previous visit (from the past 240 hour(s)).   Radiological Exams on Admission: Portable Chest 1 View  Result Date: 02/16/2019 CLINICAL DATA:  Shortness of breath EXAM: PORTABLE CHEST 1 VIEW COMPARISON:  06/21/2007 FINDINGS: Patchy opacities in both lower lungs, new since prior  study. Heart is normal size. No effusions. No acute bony abnormality. IMPRESSION: Patchy bibasilar opacities/infiltrates. Electronically Signed   By: Rolm Baptise M.D.   On: 02/16/2019 21:17   Old chart reviewed cxr bilateral opacities  Assessment/Plan 85 yo female with covid pna  Principal Problem:   Pneumonia due to COVID-19 virus- o2.  remdisivir and decadron ordered.  Lovenox/vit c/zinc ordered also.    Active Problems:   Acute respiratory failure due to COVID-19 ([place])- o2 as above and meds as above   Hyperlipemia- stable   Essential hypertension, benign- stable   GERD- stable   Vascular dementia without behavioral disturbance (HCC)- stable   CKD (chronic kidney disease) stage 3, GFR 30-59 ml/min- stable      DVT prophylaxis:  lovenox Code Status:  DNR Family Communication: none Disposition Plan:  days Consults called:  none Admission status:  admission   Jamillia Closson A MD [facility]  If 7PM-7AM, please contact night-coverage [email or web address] Password [facility]  02/16/2019, 9:34 PM

## 2019-02-17 DIAGNOSIS — I10 Essential (primary) hypertension: Secondary | ICD-10-CM

## 2019-02-17 DIAGNOSIS — N1831 Chronic kidney disease, stage 3a: Secondary | ICD-10-CM

## 2019-02-17 DIAGNOSIS — J9600 Acute respiratory failure, unspecified whether with hypoxia or hypercapnia: Secondary | ICD-10-CM

## 2019-02-17 LAB — CBC WITH DIFFERENTIAL/PLATELET
Abs Immature Granulocytes: 0.11 10*3/uL — ABNORMAL HIGH (ref 0.00–0.07)
Basophils Absolute: 0 10*3/uL (ref 0.0–0.1)
Basophils Relative: 0 %
Eosinophils Absolute: 0 10*3/uL (ref 0.0–0.5)
Eosinophils Relative: 0 %
HCT: 36.3 % (ref 36.0–46.0)
Hemoglobin: 11.7 g/dL — ABNORMAL LOW (ref 12.0–15.0)
Immature Granulocytes: 5 %
Lymphocytes Relative: 27 %
Lymphs Abs: 0.6 10*3/uL — ABNORMAL LOW (ref 0.7–4.0)
MCH: 30.1 pg (ref 26.0–34.0)
MCHC: 32.2 g/dL (ref 30.0–36.0)
MCV: 93.3 fL (ref 80.0–100.0)
Monocytes Absolute: 0.3 10*3/uL (ref 0.1–1.0)
Monocytes Relative: 11 %
Neutro Abs: 1.3 10*3/uL — ABNORMAL LOW (ref 1.7–7.7)
Neutrophils Relative %: 57 %
Platelets: 151 10*3/uL (ref 150–400)
RBC: 3.89 MIL/uL (ref 3.87–5.11)
RDW: 13.4 % (ref 11.5–15.5)
WBC: 2.3 10*3/uL — ABNORMAL LOW (ref 4.0–10.5)
nRBC: 0 % (ref 0.0–0.2)

## 2019-02-17 LAB — COMPREHENSIVE METABOLIC PANEL
ALT: 28 U/L (ref 0–44)
AST: 63 U/L — ABNORMAL HIGH (ref 15–41)
Albumin: 2.9 g/dL — ABNORMAL LOW (ref 3.5–5.0)
Alkaline Phosphatase: 49 U/L (ref 38–126)
Anion gap: 14 (ref 5–15)
BUN: 45 mg/dL — ABNORMAL HIGH (ref 8–23)
CO2: 20 mmol/L — ABNORMAL LOW (ref 22–32)
Calcium: 7.9 mg/dL — ABNORMAL LOW (ref 8.9–10.3)
Chloride: 105 mmol/L (ref 98–111)
Creatinine, Ser: 1.36 mg/dL — ABNORMAL HIGH (ref 0.44–1.00)
GFR calc Af Amer: 38 mL/min — ABNORMAL LOW (ref >60)
GFR calc non Af Amer: 33 mL/min — ABNORMAL LOW (ref >60)
Glucose, Bld: 179 mg/dL — ABNORMAL HIGH (ref 70–99)
Potassium: 3.1 mmol/L — ABNORMAL LOW (ref 3.5–5.1)
Sodium: 139 mmol/L (ref 135–145)
Total Bilirubin: 0.7 mg/dL (ref 0.3–1.2)
Total Protein: 6.1 g/dL — ABNORMAL LOW (ref 6.5–8.1)

## 2019-02-17 LAB — C-REACTIVE PROTEIN: CRP: 7.1 mg/dL — ABNORMAL HIGH (ref <1.0)

## 2019-02-17 LAB — D-DIMER, QUANTITATIVE: D-Dimer, Quant: 1.58 ug/mL-FEU — ABNORMAL HIGH (ref 0.00–0.50)

## 2019-02-17 MED ORDER — ENSURE ENLIVE PO LIQD
237.0000 mL | Freq: Three times a day (TID) | ORAL | Status: DC
  Administered 2019-02-17 – 2019-02-19 (×3): 237 mL via ORAL

## 2019-02-17 MED ORDER — HEPARIN SODIUM (PORCINE) 5000 UNIT/ML IJ SOLN
5000.0000 [IU] | Freq: Three times a day (TID) | INTRAMUSCULAR | Status: DC
  Administered 2019-02-17 – 2019-02-20 (×8): 5000 [IU] via SUBCUTANEOUS
  Filled 2019-02-17 (×8): qty 1

## 2019-02-17 NOTE — Progress Notes (Signed)
PROGRESS NOTE                                                                                                                                                                                                             Patient Demographics:    Tracy Novak, is a 85 y.o. female, DOB - 1919-08-06, [id number]  Outpatient Primary MD for the patient is Venia Carbon, MD   Admit date - 02/16/2019   LOS - 1  No chief complaint on file.      Brief Narrative: Patient is a 85 y.o. female with PMHx of dementia, CVA, HTN, dyslipidemia-who presented with shortness of breath-found to have acute hypoxic respiratory failure secondary to COVID-19 pneumonia.  See below for further details   Subjective:    Tracy Novak today    Assessment  & Plan :   Acute Hypoxic Resp Failure due to Covid 19 Viral pneumonia: Stable-now on room air-continue steroids and remdesivir.  Fever: afebrile  O2 requirements: On RA  COVID-19 Labs: Recent Labs    02/16/19 1850 02/17/19 0525  DDIMER 2.15* 1.58*  FERRITIN 186  --   CRP 7.0* 7.1*    No results found for: SARSCOV2NAA   COVID-19 Medications: Steroids: 11/5>> Remdesivir: 11/5>> Actemra: Not indicated given clinical stability. Convalescent Plasma:N/A Research Studies:N/A  Other medications: Diuretics:Euvolemic-no need for lasix Antibiotics:Not needed as no evidence of bacterial infection  Prone/Incentive Spirometry: Will not be cooperative due to advanced dementia.  DVT Prophylaxis  :  Heparin   HTN: Controlled-monitor off antihypertensives  Advanced dementia: Supportive care-at risk for delirium.  CKD stage IIIb: Creatinine close to usual baseline-follow supportive care.  ABG: No results found for: PHART, PCO2ART, PO2ART, HCO3, TCO2, ACIDBASEDEF, O2SAT  Vent Settings: N/A  Condition - Stable  Family Communication  :  Son updated over the phone  Code Status :  Full  Code  Diet :  Diet Order            Diet Heart Room service appropriate? Yes; Fluid consistency: Thin  Diet effective now               Disposition Plan  :  Remain hospitalized  Barriers to discharge: Hypoxia requiring O2 supplementation/complete 5 days of IV Remdesivir  Consults  :  None  Procedures  :  None  Antibiotics  :  Anti-infectives (From admission, onward)   Start     Dose/Rate Route Frequency Ordered Stop   02/18/19 1600  remdesivir 100 mg in sodium chloride 0.9 % 250 mL IVPB     100 mg 500 mL/hr over 30 Minutes Intravenous Every 24 hours 02/16/19 2322 02/22/19 1559   02/17/19 0000  remdesivir 200 mg in sodium chloride 0.9 % 250 mL IVPB     200 mg 500 mL/hr over 30 Minutes Intravenous Once 02/16/19 2322 02/17/19 0038      Inpatient Medications  Scheduled Meds: . dexamethasone (DECADRON) injection  6 mg Intravenous Q24H  . heparin injection (subcutaneous)  5,000 Units Subcutaneous Q8H  . sodium chloride flush  3 mL Intravenous Q12H  . vitamin C  500 mg Oral Daily  . zinc sulfate  220 mg Oral Daily   Continuous Infusions: . sodium chloride    . [START ON 02/18/2019] remdesivir 100 mg in NS 250 mL     PRN Meds:.sodium chloride, acetaminophen, guaiFENesin-dextromethorphan, ondansetron **OR** ondansetron (ZOFRAN) IV, sodium chloride flush   Time Spent in minutes  25  See all Orders from today for further details   Oren Binet M.D on 02/17/2019 at 8:49 AM  To page go to [email or web address] - use universal password  [facility] -  Office  [phone]    Objective:   Vitals:   02/16/19 1930 02/16/19 1934 02/17/19 0353 02/17/19 0725  BP: 126/61  108/64 108/82  Pulse: 92  98 63  Resp: 18  18 18   Temp: 97.6 F (36.4 C)  98.7 F (37.1 C) 98.5 F (36.9 C)  TempSrc: Axillary  Axillary Axillary  SpO2: 98%  95% 93%  Weight:  58.6 kg    Height:  5\' 1"  (1.549 m)      Wt Readings from Last 3 Encounters:  02/16/19 58.6 kg  12/10/17 62.1 kg   07/12/17 63.5 kg    No intake or output data in the 24 hours ending 02/17/19 0849   Physical Exam Gen Exam: Very confused-but not in any distress HEENT:atraumatic, normocephalic Chest: B/L clear to auscultation anteriorly CVS:S1S2 regular Abdomen:soft non tender, non distended Extremities:no edema Neurology: Difficult exam-but moving all 4 extremities Skin: no rash   Data Review:    CBC Recent Labs  Lab 02/16/19 1850 02/17/19 0525  WBC 5.5 2.3*  HGB 13.3 11.7*  HCT 41.5 36.3  PLT 145* 151  MCV 95.2 93.3  MCH 30.5 30.1  MCHC 32.0 32.2  RDW 13.5 13.4  LYMPHSABS 1.0 0.6*  MONOABS 1.7* 0.3  EOSABS 0.0 0.0  BASOSABS 0.0 0.0    Chemistries  Recent Labs  Lab 02/16/19 1850 02/17/19 0525  NA 135 139  K 3.0* 3.1*  CL 103 105  CO2 21* 20*  GLUCOSE 120* 179*  BUN 49* 45*  CREATININE 1.54* 1.36*  CALCIUM 8.0* 7.9*  AST 73* 63*  ALT 30 28  ALKPHOS 54 49  BILITOT 0.6 0.7   ------------------------------------------------------------------------------------------------------------------ No results for input(s): CHOL, HDL, LDLCALC, TRIG, CHOLHDL, LDLDIRECT in the last 72 hours.  No results found for: HGBA1C ------------------------------------------------------------------------------------------------------------------ No results for input(s): TSH, T4TOTAL, T3FREE, THYROIDAB in the last 72 hours.  Invalid input(s): FREET3 ------------------------------------------------------------------------------------------------------------------ Recent Labs    02/16/19 1850  FERRITIN 186    Coagulation profile No results for input(s): INR, PROTIME in the last 168 hours.  Recent Labs    02/16/19 1850 02/17/19 0525  DDIMER 2.15* 1.58*    Cardiac Enzymes No results for input(s): CKMB, TROPONINI, MYOGLOBIN in the  last 168 hours.  Invalid input(s): CK  ------------------------------------------------------------------------------------------------------------------    Component Value Date/Time   BNP 66.3 02/16/2019 1850    Micro Results No results found for this or any previous visit (from the past 240 hour(s)).  Radiology Reports Portable Chest 1 View  Result Date: 02/16/2019 CLINICAL DATA:  Shortness of breath EXAM: PORTABLE CHEST 1 VIEW COMPARISON:  06/21/2007 FINDINGS: Patchy opacities in both lower lungs, new since prior study. Heart is normal size. No effusions. No acute bony abnormality. IMPRESSION: Patchy bibasilar opacities/infiltrates. Electronically Signed   By: Rolm Baptise M.D.   On: 02/16/2019 21:17

## 2019-02-17 NOTE — Evaluation (Signed)
Physical Therapy Evaluation Patient Details Name: Tracy Novak MRN: [id number] DOB: 1919/07/23 Today's Date: 02/17/2019   History of Present Illness  Tracy Novak is a 85 y.o. female with medical history significant of dementia, htn, resides at [place] term [facility]F, comes in with worsening sob tested positive for covid 10/28.  Chest xray shows pneumonia.  Clinical Impression  PT evaluation limited by patient being resistive to bed mobility. Patient moaning out with any movements in bed. '  After settled, offered water and  Thick apple juice, patient no receptive. Did repeat over and over," That's mine" and appeared angry. Do not know what patient was referring to.   Patient  Comes from [place] term care at [facility]. Not further skilled PT needs identified. PT will sign off.    Follow Up Recommendations No PT follow up    Equipment Recommendations  None recommended by PT    Recommendations for Other Services       Precautions / Restrictions Precautions Precautions: Fall Precaution Comments: did get a bit agitated, moans an groans when moved around      Mobility  Bed Mobility Overal bed mobility: Needs Assistance Bed Mobility: Rolling Rolling: +2 for safety/equipment;+2 for physical assistance;Total assist         General bed mobility comments: patient offers no assist, more resistive  Transfers                 General transfer comment: unable to attempt due to MS  Ambulation/Gait                Stairs            Wheelchair Mobility    Modified Rankin (Stroke Patients Only)       Balance                                             Pertinent Vitals/Pain Pain Assessment: Faces Faces Pain Scale: Hurts a little bit Pain Location: generalized when rolling Pain Descriptors / Indicators: Moaning;Discomfort Pain Intervention(s): Monitored during session    Home Living Family/patient expects to be discharged to::  Skilled nursing facility                      Prior Function           Comments: unsure, has been in LTC, severe dementia.     Hand Dominance        Extremity/Trunk Assessment   Upper Extremity Assessment Upper Extremity Assessment: (pt moves arms when riolled to resist)    Lower Extremity Assessment Lower Extremity Assessment: (kept legs in etension when rolling, no  attempt by patient to flex legs, noted reddened heel, applied allevyn)    Cervical / Trunk Assessment Cervical / Trunk Assessment: Other exceptions  Communication   Communication: Expressive difficulties(does not follow commands, speech is gibberish except a few words,)  Cognition Arousal/Alertness: Awake/alert Behavior During Therapy: Agitated Overall Cognitive Status: History of cognitive impairments - at baseline                                 General Comments: per chart      General Comments General comments (skin integrity, edema, etc.): NT    Exercises     Assessment/Plan  PT Assessment All further PT needs can be met in the next venue of care  PT Problem List Decreased mobility;Decreased safety awareness;Decreased coordination;Decreased knowledge of precautions;Decreased activity tolerance;Decreased cognition;Pain;Decreased skin integrity;Impaired tone       PT Treatment Interventions      PT Goals (Current goals can be found in the Care Plan section)  Acute Rehab PT Goals PT Goal Formulation: All assessment and education complete, DC therapy    Frequency     Barriers to discharge        Co-evaluation               AM-PAC PT "6 Clicks" Mobility  Outcome Measure Help needed turning from your back to your side while in a flat bed without using bedrails?: Total Help needed moving from lying on your back to sitting on the side of a flat bed without using bedrails?: Total Help needed moving to and from a bed to a chair (including a wheelchair)?:  Total Help needed standing up from a chair using your arms (e.g., wheelchair or bedside chair)?: Total Help needed to walk in hospital room?: Total Help needed climbing 3-5 steps with a railing? : Total 6 Click Score: 6    End of Session   Activity Tolerance: Treatment limited secondary to agitation Patient left: in bed;with bed alarm set Nurse Communication: Mobility status;Need for lift equipment PT Visit Diagnosis: Difficulty in walking, not elsewhere classified (R26.2)    Time: 9068-9340 PT Time Calculation (min) (ACUTE ONLY): 13 min   Charges:   PT Evaluation $PT Eval Moderate Complexity: [place] Pager [phone] Office [phone]   Claretha Cooper 02/17/2019, 1:01 PM

## 2019-02-17 NOTE — Progress Notes (Signed)
Initial Nutrition Assessment  DOCUMENTATION CODES:   Not applicable  INTERVENTION:    Liberalize diet to regular.  Offer Ensure Enlive po TID, each supplement provides 350 kcal and 20 grams of protein.  Pt receiving Hormel Shake daily with Breakfast which provides 520 kcals and 22 g of protein and Magic cup BID with lunch and dinner, each supplement provides 290 kcal and 9 grams of protein, automatically on meal trays to optimize nutritional intake.   NUTRITION DIAGNOSIS:   Increased nutrient needs related to acute illness(COVID-19) as evidenced by estimated needs.  GOAL:   Patient will meet greater than or equal to 90% of their needs  MONITOR:   PO intake, Supplement acceptance  REASON FOR ASSESSMENT:   Malnutrition Screening Tool    ASSESSMENT:   85 yo female admitted with worsening SOB, PNA, tested positive for COVID-19 on 10/28. PMH includes dementia, HLD, HTN, CVA.   No recent weights available for review.   Suspect intake has been poor due to dementia and recent COVID illness. No meal completions documented at this time. Labs reviewed. Potassium 3.1 (L) Medications reviewed and include decadron, vitamin C, zinc sulfate.  NUTRITION - FOCUSED PHYSICAL EXAM:  deferred  Diet Order:   Diet Order            Diet Heart Room service appropriate? Yes; Fluid consistency: Thin  Diet effective now              EDUCATION NEEDS:   Not appropriate for education at this time  Skin:  Skin Assessment: Reviewed RN Assessment(MASD to buttocks and groin)  Last BM:  no BM documented  Height:   Ht Readings from Last 1 Encounters:  02/16/19 5\' 1"  (1.549 m)    Weight:   Wt Readings from Last 1 Encounters:  02/16/19 58.6 kg    Ideal Body Weight:  47.7 kg  BMI:  Body mass index is 24.41 kg/m.  Estimated Nutritional Needs:   Kcal:  1400-1600  Protein:  75-90 gm  Fluid:  >/= 1.5 L    Molli Barrows, RD, LDN, [place] Pager [phone] After Hours Pager  [phone]

## 2019-02-17 NOTE — Progress Notes (Signed)
OT Cancellation Note  Patient Details Name: Tracy Novak MRN: [id number] DOB: 04-07-1920   Cancelled Treatment:    Reason Eval/Treat Not Completed: OT screened, no needs identified, will sign off; spoke with PT who relayed message from [place]. Pt from [facility] at baseline and plans to return to facility at time of discharge. No current acute care needs at this time. Acute OT to sign off. Thank you for this referral.   Lou Cal, OT [facility] Pager [phone] Office [phone]   Raymondo Band 02/17/2019, 4:46 PM

## 2019-02-17 NOTE — Evaluation (Signed)
Clinical/Bedside Swallow Evaluation Patient Details  Name: KAMILLAH DEVIVO MRN: [id number] Date of Birth: May 12, 1919  Today's Date: 02/17/2019 Time: SLP Start Time (ACUTE ONLY): 1120 SLP Stop Time (ACUTE ONLY): 1138 SLP Time Calculation (min) (ACUTE ONLY): 18 min  Past Medical History:  Past Medical History:  Diagnosis Date  . CVA (cerebral vascular accident) ([place]) 11/2004  . Diverticulosis   . GERD (gastroesophageal reflux disease)   . History of colon polyps   . Hyperlipidemia   . Hypertension   . MVP (mitral valve prolapse)    Echo- negative 11/04  . Obstruction of bowel ([place]) 3/09   Laparatomy and enterolysis- Dr. Rochel Brome  . Osteoarthritis   . TIA (transient ischemic attack)    hospitalized 12/02, brain MRI 12/02   Past Surgical History:  Past Surgical History:  Procedure Laterality Date  . APPENDECTOMY    . CATARACT EXTRACTION  2000   with IOL  . REPLACEMENT TOTAL KNEE  1999   right  . ROTATOR CUFF REPAIR     right   HPI:  85 yo female admitted with worsening SOB, PNA, tested positive for COVID-19 on 10/28. PMH includes dementia, HLD, HTN, CVA.    Assessment / Plan / Recommendation Clinical Impression  Pt is highly distractible, at the moment primarily by internal distracters. She needs a lot of encouragement to try to eat and drink and will only do so if self-feeding. She did try a small amount of POs, mostly water, with no overt signs of aspiration and oropharyngeal swallow appearing grossly functional (as can be determined clinically). She does have mild difficulty clearing a piece of dry graham cracker from her mouth, suspect in part related to xerostomia. Liquid washes helped. Recommend softening to mechanical soft (Dys 3) and continuing thin liquids. She would benefit from supervision during meals as her mentation puts her at risk for intermittent aspiration and/or inadequate intake. SLP to f/u briefly for tolerance.   SLP Visit Diagnosis: Dysphagia, unspecified  (R13.10)    Aspiration Risk  Mild aspiration risk    Diet Recommendation Dysphagia 3 (Mech soft);Thin liquid   Liquid Administration via: Straw;Cup Medication Administration: Whole meds with puree Supervision: Patient able to self feed;Full supervision/cueing for compensatory strategies Compensations: Slow rate;Small sips/bites Postural Changes: Seated upright at 90 degrees    Other  Recommendations Oral Care Recommendations: Oral care BID   Follow up Recommendations Skilled Nursing facility      Frequency and Duration min 2x/week  1 week       Prognosis Prognosis for Safe Diet Advancement: Fair Barriers to Reach Goals: Cognitive deficits      Swallow Study   General HPI: 85 yo female admitted with worsening SOB, PNA, tested positive for COVID-19 on 10/28. PMH includes dementia, HLD, HTN, CVA.  Type of Study: Bedside Swallow Evaluation Previous Swallow Assessment: none in chart Diet Prior to this Study: Regular;Thin liquids Temperature Spikes Noted: No Respiratory Status: Room air History of Recent Intubation: No Behavior/Cognition: Alert;Confused;Requires cueing Oral Cavity Assessment: (does not let me look closely) Oral Care Completed by SLP: No Oral Cavity - Dentition: Adequate natural dentition Vision: Functional for self-feeding Self-Feeding Abilities: Able to feed self Patient Positioning: Upright in bed Baseline Vocal Quality: Normal    Oral/Motor/Sensory Function Overall Oral Motor/Sensory Function: (doesn't follow commands to assess directly)   Ice Chips Ice chips: Not tested(pt declined)   Thin Liquid Thin Liquid: Within functional limits Presentation: Straw    Nectar Thick Nectar Thick Liquid: Not tested  Honey Thick Honey Thick Liquid: Not tested   Puree Puree: Within functional limits Presentation: Self Fed;Spoon   Solid     Solid: Impaired Presentation: Self Fed Oral Phase Impairments: Impaired mastication Oral Phase Functional Implications:  Oral residue      Venita Sheffield Berma Harts 02/17/2019,1:09 PM  Pollyann Glen, M.A. [place] Acute Environmental education officer [phone] Office [phone]

## 2019-02-17 NOTE — TOC Initial Note (Signed)
Transition of Care [facility]) - Initial/Assessment Note    Patient Details  Name: Tracy Novak MRN: [id number] Date of Birth: Apr 13, 1920  [facility] CM/SW Contact:    Shade Flood, LCSW Phone Number: 02/17/2019, 12:28 PM  Clinical Narrative:                  Pt admitted from [facility]. Spoke with Seth Bake at [facility] to update. Pt is in long term care and reportedly has severe dementia. Seth Bake states pt can return to [facility] at [facility].  TOC team will follow.   Expected Discharge Plan: Long Term Nursing Home Barriers to Discharge: Continued Medical Work up   Patient Goals and CMS Choice        Expected Discharge Plan and Services Expected Discharge Plan: [place] In-house Referral: Clinical Social Work     Living arrangements for the past 2 months: [place]                                      Prior Living Arrangements/Services Living arrangements for the past 2 months: [place] Lives with:: Facility Resident Patient language and need for interpreter reviewed:: No Do you feel safe going back to the place where you live?: Yes      Need for Family Participation in Patient Care: No (Comment) Care giver support system in place?: Yes (comment)   Criminal Activity/Legal Involvement Pertinent to Current Situation/Hospitalization: No - Comment as needed  Activities of Daily Living Home Assistive Devices/Equipment: Wheelchair ADL Screening (condition at time of admission) Patient's cognitive ability adequate to safely complete daily activities?: No Is the patient deaf or have difficulty hearing?: Yes Does the patient have difficulty seeing, even when wearing glasses/contacts?: No Does the patient have difficulty concentrating, remembering, or making decisions?: Yes Patient able to express need for assistance with ADLs?: Yes Does the patient have difficulty dressing or bathing?: Yes Independently performs  ADLs?: No Communication: Independent Dressing (OT): Dependent Is this a change from baseline?: Pre-admission baseline Grooming: Dependent Is this a change from baseline?: Pre-admission baseline Feeding: Needs assistance Is this a change from baseline?: Pre-admission baseline Bathing: Dependent Is this a change from baseline?: Pre-admission baseline Toileting: Dependent Is this a change from baseline?: Pre-admission baseline In/Out Bed: Dependent Is this a change from baseline?: Pre-admission baseline Walks in Home: Dependent Is this a change from baseline?: Pre-admission baseline Does the patient have difficulty walking or climbing stairs?: Yes Weakness of Legs: Both Weakness of Arms/Hands: Both  Permission Sought/Granted                  Emotional Assessment Appearance:: Appears stated age     Orientation: : Oriented to Self Alcohol / Substance Use: Not Applicable Psych Involvement: No (comment)  Admission diagnosis:  COVID 19 Patient Active Problem List   Diagnosis Date Noted  . Pneumonia due to COVID-19 virus 02/16/2019  . Acute respiratory failure due to COVID-19 ([place]) 02/16/2019  . Chronic pain of left knee 12/10/2017  . CKD (chronic kidney disease) stage 3, GFR 30-59 ml/min 11/05/2016  . Localized edema 11/05/2016  . History of fall within past 90 days 12/15/2015  . Urge urinary incontinence 12/15/2015  . Osteoarthritis of right knee 11/28/2014  . Encounter for Medicare annual wellness exam 01/18/2014  . Advanced directives, counseling/discussion 01/18/2014  . Vascular dementia without behavioral disturbance ([place]) 01/18/2014  .  CONSTIPATION 06/26/2008  . DJD (degenerative joint disease) of knee 03/30/2008  . Hyperlipemia 11/10/2006  . Essential hypertension, benign 11/10/2006  . INTERNAL HEMORRHOIDS 11/10/2006  . GERD 11/10/2006  . COLONIC POLYPS, HX OF 11/10/2006   PCP:  Venia Carbon, MD Pharmacy:   [place], [address] [place] [address] Phone: [phone] Fax: [phone]     Social Determinants of Health (SDOH) Interventions    Readmission Risk Interventions Readmission Risk Prevention Plan 02/17/2019  Medication Screening Complete  Transportation Screening Complete  Some recent data might be hidden

## 2019-02-18 ENCOUNTER — Inpatient Hospital Stay (HOSPITAL_COMMUNITY): Payer: Medicare Other

## 2019-02-18 LAB — COMPREHENSIVE METABOLIC PANEL
ALT: 29 U/L (ref 0–44)
AST: 51 U/L — ABNORMAL HIGH (ref 15–41)
Albumin: 2.9 g/dL — ABNORMAL LOW (ref 3.5–5.0)
Alkaline Phosphatase: 48 U/L (ref 38–126)
Anion gap: 12 (ref 5–15)
BUN: 53 mg/dL — ABNORMAL HIGH (ref 8–23)
CO2: 21 mmol/L — ABNORMAL LOW (ref 22–32)
Calcium: 8.5 mg/dL — ABNORMAL LOW (ref 8.9–10.3)
Chloride: 109 mmol/L (ref 98–111)
Creatinine, Ser: 1.11 mg/dL — ABNORMAL HIGH (ref 0.44–1.00)
GFR calc Af Amer: 48 mL/min — ABNORMAL LOW (ref >60)
GFR calc non Af Amer: 42 mL/min — ABNORMAL LOW (ref >60)
Glucose, Bld: 183 mg/dL — ABNORMAL HIGH (ref 70–99)
Potassium: 3.4 mmol/L — ABNORMAL LOW (ref 3.5–5.1)
Sodium: 142 mmol/L (ref 135–145)
Total Bilirubin: 0.6 mg/dL (ref 0.3–1.2)
Total Protein: 6.3 g/dL — ABNORMAL LOW (ref 6.5–8.1)

## 2019-02-18 LAB — CBC WITH DIFFERENTIAL/PLATELET
Abs Immature Granulocytes: 0.23 10*3/uL — ABNORMAL HIGH (ref 0.00–0.07)
Basophils Absolute: 0 10*3/uL (ref 0.0–0.1)
Basophils Relative: 1 %
Eosinophils Absolute: 0 10*3/uL (ref 0.0–0.5)
Eosinophils Relative: 0 %
HCT: 37.3 % (ref 36.0–46.0)
Hemoglobin: 12.4 g/dL (ref 12.0–15.0)
Immature Granulocytes: 5 %
Lymphocytes Relative: 21 %
Lymphs Abs: 1 10*3/uL (ref 0.7–4.0)
MCH: 30.7 pg (ref 26.0–34.0)
MCHC: 33.2 g/dL (ref 30.0–36.0)
MCV: 92.3 fL (ref 80.0–100.0)
Monocytes Absolute: 0.6 10*3/uL (ref 0.1–1.0)
Monocytes Relative: 13 %
Neutro Abs: 2.8 10*3/uL (ref 1.7–7.7)
Neutrophils Relative %: 60 %
Platelets: 207 10*3/uL (ref 150–400)
RBC: 4.04 MIL/uL (ref 3.87–5.11)
RDW: 13.5 % (ref 11.5–15.5)
WBC: 4.7 10*3/uL (ref 4.0–10.5)
nRBC: 0 % (ref 0.0–0.2)

## 2019-02-18 LAB — C-REACTIVE PROTEIN: CRP: 4.6 mg/dL — ABNORMAL HIGH (ref <1.0)

## 2019-02-18 LAB — D-DIMER, QUANTITATIVE: D-Dimer, Quant: 1.89 ug/mL-FEU — ABNORMAL HIGH (ref 0.00–0.50)

## 2019-02-18 MED ORDER — MORPHINE SULFATE (PF) 2 MG/ML IV SOLN
1.0000 mg | INTRAVENOUS | Status: DC | PRN
  Administered 2019-02-18 – 2019-02-20 (×8): 1 mg via INTRAVENOUS
  Filled 2019-02-18 (×8): qty 1

## 2019-02-18 MED ORDER — HALOPERIDOL LACTATE 5 MG/ML IJ SOLN
2.0000 mg | Freq: Four times a day (QID) | INTRAMUSCULAR | Status: DC | PRN
  Administered 2019-02-18: 13:00:00 2 mg via INTRAVENOUS
  Filled 2019-02-18: qty 1

## 2019-02-18 MED ORDER — POTASSIUM CHLORIDE 10 MEQ/100ML IV SOLN
10.0000 meq | INTRAVENOUS | Status: AC
  Administered 2019-02-18 (×2): 10 meq via INTRAVENOUS
  Filled 2019-02-18 (×2): qty 100

## 2019-02-18 MED ORDER — DEXTROSE 5 % IV SOLN
INTRAVENOUS | Status: DC
  Administered 2019-02-18: 13:00:00 via INTRAVENOUS

## 2019-02-18 NOTE — Progress Notes (Signed)
PROGRESS NOTE                                                                                                                                                                                                             Patient Demographics:    Tracy Novak, is a 85 y.o. female, DOB - [birth year], [id number]  Outpatient Primary MD for the patient is Venia Carbon, MD   Admit date - 02/16/2019   LOS - 2  No chief complaint on file.      Brief Narrative: Patient is a 85 y.o. female with PMHx of dementia, CVA, HTN, dyslipidemia-who presented with shortness of breath-found to have acute hypoxic respiratory failure secondary to COVID-19 pneumonia.  See below for further details   Subjective:    Tracy Novak today in bed appears confused, unable to answer questions or follow commands appropriately.   Assessment  & Plan :   Acute Hypoxic Resp Failure due to Covid 19 Viral pneumonia: She has moderate disease, started on IV steroids and remdesivir currently not severely hypoxic will monitor closely.   COVID-19 Labs: Recent Labs    02/16/19 1850 02/17/19 0525 02/18/19 0709  DDIMER 2.15* 1.58* 1.89*  FERRITIN 186  --   --   CRP 7.0* 7.1* 4.6*    No results found for: SARSCOV2NAA   COVID-19 Medications: Steroids: 11/5>> Remdesivir: 11/5>>     HTN: Controlled-monitor off antihypertensives  Advanced dementia: Supportive care-at risk for delirium.  CKD stage IIIb: Creatinine close to usual baseline-follow supportive care.  Hypokalemia.  Replaced.    Weakness and deconditioning.  Appears cachectic.  Is 85 years old, general prognosis as such is very poor, discussed with son clearly that patient's overall prognosis is guarded.  He agrees.  Supportive care and gentle medical treatment to continue, if at any point it looks like she is suffering we will transition to full comfort care.  As needed morphine added as  bedside nurse on 02/18/2019 concerned that patient at times is moaning in pain.     Condition - guarded  Family Communication  :  Son updated over the phone on 02/18/2019, DNR.  If declines focus on comfort.  Code Status :  Full Code  Diet :  Diet Order            DIET DYS 3 Room service  appropriate? Yes; Fluid consistency: Thin  Diet effective now               Disposition Plan  :  Remain hospitalized  Barriers to discharge: Hypoxia requiring O2 supplementation/complete 5 days of IV Remdesivir  Consults  :  None  Procedures  :  None  Antibiotics  :    Anti-infectives (From admission, onward)   Start     Dose/Rate Route Frequency Ordered Stop   02/18/19 1600  remdesivir 100 mg in sodium chloride 0.9 % 250 mL IVPB     100 mg 500 mL/hr over 30 Minutes Intravenous Every 24 hours 02/16/19 2322 02/22/19 1559   02/17/19 0000  remdesivir 200 mg in sodium chloride 0.9 % 250 mL IVPB     200 mg 500 mL/hr over 30 Minutes Intravenous Once 02/16/19 2322 02/17/19 0045      DVT Prophylaxis  :  Heparin   Inpatient Medications  Scheduled Meds:  dexamethasone (DECADRON) injection  6 mg Intravenous Q24H   feeding supplement (ENSURE ENLIVE)  237 mL Oral TID BM   heparin injection (subcutaneous)  5,000 Units Subcutaneous Q8H   sodium chloride flush  3 mL Intravenous Q12H   vitamin C  500 mg Oral Daily   zinc sulfate  220 mg Oral Daily   Continuous Infusions:  sodium chloride     potassium chloride     remdesivir 100 mg in NS 250 mL     PRN Meds:.sodium chloride, acetaminophen, guaiFENesin-dextromethorphan, morphine injection, ondansetron **OR** ondansetron (ZOFRAN) IV, sodium chloride flush   Time Spent in minutes  25  See all Orders from today for further details   Lala Lund M.D on 02/18/2019 at 11:38 AM  To page go to [email or web address] - use universal password  [facility] -  Office  [phone]    Objective:   Vitals:   02/17/19 1750 02/17/19  1956 02/18/19 0404 02/18/19 0806  BP: 120/70  120/76 (!) 157/75  Pulse: 98  76 82  Resp: 18 18  20   Temp: 98.7 F (37.1 C) 98.5 F (36.9 C) 98.8 F (37.1 C) 99.6 F (37.6 C)  TempSrc: Axillary Oral Oral Axillary  SpO2: 94%  92% 91%  Weight:      Height:        Wt Readings from Last 3 Encounters:  02/16/19 58.6 kg  12/10/17 62.1 kg  07/12/17 63.5 kg     Intake/Output Summary (Last 24 hours) at 02/18/2019 1138 Last data filed at 02/18/2019 0912 Gross per 24 hour  Intake 0 ml  Output 950 ml  Net -950 ml     Physical Exam  Awake but confused, unable to answer questions appropriately or follow commands, [name].AT,PERRAL Supple Neck,No JVD, No cervical lymphadenopathy appriciated.  Symmetrical Chest wall movement, Good air movement bilaterally, CTAB RRR,No Gallops, Rubs or new Murmurs, No Parasternal Heave +ve B.Sounds, Abd is somewhat taut, bedside bladder scan shows less than 200 cc of urine, KUB stable, patient unable to reliably cooperate in exam, no clear tenderness elicited No Cyanosis, Clubbing or edema, No new Rash or bruise    Data Review:    CBC Recent Labs  Lab 02/16/19 1850 02/17/19 0525 02/18/19 0709  WBC 5.5 2.3* 4.7  HGB 13.3 11.7* 12.4  HCT 41.5 36.3 37.3  PLT 145* 151 207  MCV 95.2 93.3 92.3  MCH 30.5 30.1 30.7  MCHC 32.0 32.2 33.2  RDW 13.5 13.4 13.5  LYMPHSABS 1.0 0.6* 1.0  MONOABS 1.7* 0.3 0.6  EOSABS  0.0 0.0 0.0  BASOSABS 0.0 0.0 0.0    Chemistries  Recent Labs  Lab 02/16/19 1850 02/17/19 0525 02/18/19 0709  NA 135 139 142  K 3.0* 3.1* 3.4*  CL 103 105 109  CO2 21* 20* 21*  GLUCOSE 120* 179* 183*  BUN 49* 45* 53*  CREATININE 1.54* 1.36* 1.11*  CALCIUM 8.0* 7.9* 8.5*  AST 73* 63* 51*  ALT 30 28 29   ALKPHOS 54 49 48  BILITOT 0.6 0.7 0.6   ------------------------------------------------------------------------------------------------------------------ No results for input(s): CHOL, HDL, LDLCALC, TRIG, CHOLHDL, LDLDIRECT in  the last 72 hours.  No results found for: HGBA1C ------------------------------------------------------------------------------------------------------------------ No results for input(s): TSH, T4TOTAL, T3FREE, THYROIDAB in the last 72 hours.  Invalid input(s): FREET3 ------------------------------------------------------------------------------------------------------------------ Recent Labs    02/16/19 1850  FERRITIN 186    Coagulation profile No results for input(s): INR, PROTIME in the last 168 hours.  Recent Labs    02/17/19 0525 02/18/19 0709  DDIMER 1.58* 1.89*    Cardiac Enzymes No results for input(s): CKMB, TROPONINI, MYOGLOBIN in the last 168 hours.  Invalid input(s): CK ------------------------------------------------------------------------------------------------------------------    Component Value Date/Time   BNP 66.3 02/16/2019 1850    Micro Results No results found for this or any previous visit (from the past 240 hour(s)).  Radiology Reports Portable Chest 1 View  Result Date: 02/16/2019 CLINICAL DATA:  Shortness of breath EXAM: PORTABLE CHEST 1 VIEW COMPARISON:  06/21/2007 FINDINGS: Patchy opacities in both lower lungs, new since prior study. Heart is normal size. No effusions. No acute bony abnormality. IMPRESSION: Patchy bibasilar opacities/infiltrates. Electronically Signed   By: Rolm Baptise M.D.   On: 02/16/2019 21:17   Dg Abd Portable 1v  Result Date: 02/18/2019 CLINICAL DATA:  Abdominal pain. EXAM: PORTABLE ABDOMEN - 1 VIEW COMPARISON:  09/30/2010 FINDINGS: Bowel gas pattern is nonobstructive. There is no free peritoneal air. Curvature of the lumbar spine convex left unchanged. Degenerative changes of the spine and hips. IMPRESSION: Nonobstructive bowel gas pattern. Electronically Signed   By: Marin Olp M.D.   On: 02/18/2019 10:38

## 2019-02-18 NOTE — Progress Notes (Signed)
Patient's son updated by phone on patient status and plan of care.  All questions welcomed and answered.

## 2019-02-19 LAB — COMPREHENSIVE METABOLIC PANEL
ALT: 23 U/L (ref 0–44)
AST: 40 U/L (ref 15–41)
Albumin: 2.7 g/dL — ABNORMAL LOW (ref 3.5–5.0)
Alkaline Phosphatase: 40 U/L (ref 38–126)
Anion gap: 9 (ref 5–15)
BUN: 44 mg/dL — ABNORMAL HIGH (ref 8–23)
CO2: 22 mmol/L (ref 22–32)
Calcium: 8.3 mg/dL — ABNORMAL LOW (ref 8.9–10.3)
Chloride: 112 mmol/L — ABNORMAL HIGH (ref 98–111)
Creatinine, Ser: 1.11 mg/dL — ABNORMAL HIGH (ref 0.44–1.00)
GFR calc Af Amer: 48 mL/min — ABNORMAL LOW (ref >60)
GFR calc non Af Amer: 42 mL/min — ABNORMAL LOW (ref >60)
Glucose, Bld: 190 mg/dL — ABNORMAL HIGH (ref 70–99)
Potassium: 3.9 mmol/L (ref 3.5–5.1)
Sodium: 143 mmol/L (ref 135–145)
Total Bilirubin: 0.7 mg/dL (ref 0.3–1.2)
Total Protein: 5.6 g/dL — ABNORMAL LOW (ref 6.5–8.1)

## 2019-02-19 LAB — C-REACTIVE PROTEIN: CRP: 2.3 mg/dL — ABNORMAL HIGH (ref <1.0)

## 2019-02-19 LAB — CBC WITH DIFFERENTIAL/PLATELET
Abs Immature Granulocytes: 0.17 10*3/uL — ABNORMAL HIGH (ref 0.00–0.07)
Basophils Absolute: 0 10*3/uL (ref 0.0–0.1)
Basophils Relative: 0 %
Eosinophils Absolute: 0 10*3/uL (ref 0.0–0.5)
Eosinophils Relative: 0 %
HCT: 35.2 % — ABNORMAL LOW (ref 36.0–46.0)
Hemoglobin: 11.2 g/dL — ABNORMAL LOW (ref 12.0–15.0)
Immature Granulocytes: 4 %
Lymphocytes Relative: 13 %
Lymphs Abs: 0.6 10*3/uL — ABNORMAL LOW (ref 0.7–4.0)
MCH: 30.2 pg (ref 26.0–34.0)
MCHC: 31.8 g/dL (ref 30.0–36.0)
MCV: 94.9 fL (ref 80.0–100.0)
Monocytes Absolute: 0.6 10*3/uL (ref 0.1–1.0)
Monocytes Relative: 12 %
Neutro Abs: 3.5 10*3/uL (ref 1.7–7.7)
Neutrophils Relative %: 71 %
Platelets: 188 10*3/uL (ref 150–400)
RBC: 3.71 MIL/uL — ABNORMAL LOW (ref 3.87–5.11)
RDW: 13.6 % (ref 11.5–15.5)
WBC: 4.9 10*3/uL (ref 4.0–10.5)
nRBC: 0 % (ref 0.0–0.2)

## 2019-02-19 LAB — D-DIMER, QUANTITATIVE: D-Dimer, Quant: 1.65 ug/mL-FEU — ABNORMAL HIGH (ref 0.00–0.50)

## 2019-02-19 NOTE — Progress Notes (Signed)
PROGRESS NOTE                                                                                                                                                                                                             Patient Demographics:    Tracy Novak, is a 85 y.o. female, DOB - 20-May-1919, [id number]  Outpatient Primary MD for the patient is Venia Carbon, MD   Admit date - 02/16/2019   LOS - 3  No chief complaint on file.      Brief Narrative: Patient is a 85 y.o. female with PMHx of dementia, CVA, HTN, dyslipidemia-who presented with shortness of breath-found to have acute hypoxic respiratory failure secondary to COVID-19 pneumonia.  See below for further details   Subjective:    Tracy Novak today in bed appears confused, unable to answer questions or follow commands appropriately.   Assessment  & Plan :   Acute Hypoxic Resp Failure due to Covid 19 Viral pneumonia: She has moderate disease, started on IV steroids and remdesivir currently not severely hypoxic will monitor closely.   COVID-19 Labs: Recent Labs    02/16/19 1850 02/17/19 0525 02/18/19 0709 02/19/19 0055  DDIMER 2.15* 1.58* 1.89* 1.65*  FERRITIN 186  --   --   --   CRP 7.0* 7.1* 4.6* 2.3*    No results found for: SARSCOV2NAA   COVID-19 Medications: Steroids: 11/5>> Remdesivir: 11/5>>     HTN: Controlled-monitor off antihypertensives  Advanced dementia: Supportive care-at risk for delirium.  CKD stage IIIb: Creatinine close to usual baseline-follow supportive care.  Hypokalemia.  Replaced.    Weakness and deconditioning.  She is a frail 85 year old female overall general prognosis remains very poor, discussed with son clearly that patient's overall prognosis is guarded, this discussion was on 02/18/2019.  Have given her 24 hours of IV fluids and supportive care without much improvement, at this time IV fluids will be stopped,  will continue steroids and remdesivir, if she declines further will transition to full comfort care or residential hospice in a day or 2.  Minimal supportive care directed towards her discomfort now.     Condition - guarded  Family Communication  :  Son updated over the phone on 02/18/2019, DNR.  If declines focus on comfort.  Code Status :  Full Code  Diet :  Diet  Order            DIET DYS 3 Room service appropriate? Yes; Fluid consistency: Thin  Diet effective now               Disposition Plan  :  Remain hospitalized  Barriers to discharge: Hypoxia requiring O2 supplementation/complete 5 days of IV Remdesivir  Consults  :  None  Procedures  :  None  Antibiotics  :    Anti-infectives (From admission, onward)   Start     Dose/Rate Route Frequency Ordered Stop   02/18/19 1600  remdesivir 100 mg in sodium chloride 0.9 % 250 mL IVPB     100 mg 500 mL/hr over 30 Minutes Intravenous Every 24 hours 02/16/19 2322 02/22/19 1559   02/17/19 0000  remdesivir 200 mg in sodium chloride 0.9 % 250 mL IVPB     200 mg 500 mL/hr over 30 Minutes Intravenous Once 02/16/19 2322 02/17/19 0045      DVT Prophylaxis  :  Heparin   Inpatient Medications  Scheduled Meds: . dexamethasone (DECADRON) injection  6 mg Intravenous Q24H  . feeding supplement (ENSURE ENLIVE)  237 mL Oral TID BM  . heparin injection (subcutaneous)  5,000 Units Subcutaneous Q8H  . vitamin C  500 mg Oral Daily  . zinc sulfate  220 mg Oral Daily   Continuous Infusions: . remdesivir 100 mg in NS 250 mL 100 mg (02/18/19 1551)   PRN Meds:.acetaminophen, guaiFENesin-dextromethorphan, haloperidol lactate, morphine injection, [DISCONTINUED] ondansetron **OR** ondansetron (ZOFRAN) IV   Time Spent in minutes  25  See all Orders from today for further details   Lala Lund M.D on 02/19/2019 at 9:44 AM  To page go to [email or web address] - use universal password  [facility] -  Office  [phone]    Objective:    Vitals:   02/18/19 1930 02/19/19 0000 02/19/19 0400 02/19/19 0759  BP: (!) 147/90 120/72 131/65 (!) 157/82  Pulse: 75 (!) 55 70 77  Resp:  20  (!) 22  Temp: 99.4 F (37.4 C) 99.1 F (37.3 C) 98.7 F (37.1 C) 98.7 F (37.1 C)  TempSrc: Axillary Axillary Axillary Axillary  SpO2: 95% 96% 98% 98%  Weight:      Height:        Wt Readings from Last 3 Encounters:  02/16/19 58.6 kg  12/10/17 62.1 kg  07/12/17 63.5 kg     Intake/Output Summary (Last 24 hours) at 02/19/2019 0944 Last data filed at 02/19/2019 0500 Gross per 24 hour  Intake 1339.92 ml  Output 700 ml  Net 639.92 ml     Physical Exam  Awake but confused, unable to answer questions appropriately or follow commands, [place].AT,PERRAL Supple Neck,No JVD, No cervical lymphadenopathy appriciated.  Symmetrical Chest wall movement, Good air movement bilaterally, CTAB RRR,No Gallops, Rubs or new Murmurs, No Parasternal Heave +ve B.Sounds, Abd Soft, No tenderness, No organomegaly appriciated, No rebound - guarding or rigidity. No Cyanosis, Clubbing or edema, No new Rash or bruise     Data Review:    CBC Recent Labs  Lab 02/16/19 1850 02/17/19 0525 02/18/19 0709 02/19/19 0055  WBC 5.5 2.3* 4.7 4.9  HGB 13.3 11.7* 12.4 11.2*  HCT 41.5 36.3 37.3 35.2*  PLT 145* 151 207 188  MCV 95.2 93.3 92.3 94.9  MCH 30.5 30.1 30.7 30.2  MCHC 32.0 32.2 33.2 31.8  RDW 13.5 13.4 13.5 13.6  LYMPHSABS 1.0 0.6* 1.0 0.6*  MONOABS 1.7* 0.3 0.6 0.6  EOSABS 0.0 0.0 0.0  0.0  BASOSABS 0.0 0.0 0.0 0.0    Chemistries  Recent Labs  Lab 02/16/19 1850 02/17/19 0525 02/18/19 0709 02/19/19 0055  NA 135 139 142 143  K 3.0* 3.1* 3.4* 3.9  CL 103 105 109 112*  CO2 21* 20* 21* 22  GLUCOSE 120* 179* 183* 190*  BUN 49* 45* 53* 44*  CREATININE 1.54* 1.36* 1.11* 1.11*  CALCIUM 8.0* 7.9* 8.5* 8.3*  AST 73* 63* 51* 40  ALT 30 28 29 23   ALKPHOS 54 49 48 40  BILITOT 0.6 0.7 0.6 0.7    ------------------------------------------------------------------------------------------------------------------ No results for input(s): CHOL, HDL, LDLCALC, TRIG, CHOLHDL, LDLDIRECT in the last 72 hours.  No results found for: HGBA1C ------------------------------------------------------------------------------------------------------------------ No results for input(s): TSH, T4TOTAL, T3FREE, THYROIDAB in the last 72 hours.  Invalid input(s): FREET3 ------------------------------------------------------------------------------------------------------------------ Recent Labs    02/16/19 1850  FERRITIN 186    Coagulation profile No results for input(s): INR, PROTIME in the last 168 hours.  Recent Labs    02/18/19 0709 02/19/19 0055  DDIMER 1.89* 1.65*    Cardiac Enzymes No results for input(s): CKMB, TROPONINI, MYOGLOBIN in the last 168 hours.  Invalid input(s): CK ------------------------------------------------------------------------------------------------------------------    Component Value Date/Time   BNP 66.3 02/16/2019 1850    Micro Results No results found for this or any previous visit (from the past 240 hour(s)).  Radiology Reports Portable Chest 1 View  Result Date: 02/16/2019 CLINICAL DATA:  Shortness of breath EXAM: PORTABLE CHEST 1 VIEW COMPARISON:  06/21/2007 FINDINGS: Patchy opacities in both lower lungs, new since prior study. Heart is normal size. No effusions. No acute bony abnormality. IMPRESSION: Patchy bibasilar opacities/infiltrates. Electronically Signed   By: Rolm Baptise M.D.   On: 02/16/2019 21:17   Dg Abd Portable 1v  Result Date: 02/18/2019 CLINICAL DATA:  Abdominal pain. EXAM: PORTABLE ABDOMEN - 1 VIEW COMPARISON:  09/30/2010 FINDINGS: Bowel gas pattern is nonobstructive. There is no free peritoneal air. Curvature of the lumbar spine convex left unchanged. Degenerative changes of the spine and hips. IMPRESSION: Nonobstructive bowel gas  pattern. Electronically Signed   By: Marin Olp M.D.   On: 02/18/2019 10:38

## 2019-02-19 NOTE — Progress Notes (Signed)
FaceTime session provided for the patient's son, [name]. He was appreciative.

## 2019-02-19 NOTE — Progress Notes (Addendum)
Patient would not take crushed meds in applesauce. She repeatedly spit the applesauce out. She also refused the ordered Ensure.   1842 Update:  Patient has refused to eat or drink anything offered today. I have offered several options at different times with the same result.

## 2019-02-19 NOTE — Progress Notes (Signed)
Spoke with patient's son, [name]. Questions were encouraged and answered. Updated on plan. We will schedule a FaceTime session later this afternoon. He is very pleased with the care and communication provided.

## 2019-02-20 ENCOUNTER — Other Ambulatory Visit: Payer: Self-pay

## 2019-02-20 ENCOUNTER — Encounter (HOSPITAL_COMMUNITY): Payer: Self-pay

## 2019-02-20 LAB — CBC WITH DIFFERENTIAL/PLATELET
Abs Immature Granulocytes: 0.33 10*3/uL — ABNORMAL HIGH (ref 0.00–0.07)
Basophils Absolute: 0.1 10*3/uL (ref 0.0–0.1)
Basophils Relative: 1 %
Eosinophils Absolute: 0 10*3/uL (ref 0.0–0.5)
Eosinophils Relative: 0 %
HCT: 41.5 % (ref 36.0–46.0)
Hemoglobin: 13.2 g/dL (ref 12.0–15.0)
Immature Granulocytes: 4 %
Lymphocytes Relative: 9 %
Lymphs Abs: 0.8 10*3/uL (ref 0.7–4.0)
MCH: 30.3 pg (ref 26.0–34.0)
MCHC: 31.8 g/dL (ref 30.0–36.0)
MCV: 95.2 fL (ref 80.0–100.0)
Monocytes Absolute: 0.9 10*3/uL (ref 0.1–1.0)
Monocytes Relative: 10 %
Neutro Abs: 7.1 10*3/uL (ref 1.7–7.7)
Neutrophils Relative %: 76 %
Platelets: 303 10*3/uL (ref 150–400)
RBC: 4.36 MIL/uL (ref 3.87–5.11)
RDW: 13.7 % (ref 11.5–15.5)
WBC: 9.2 10*3/uL (ref 4.0–10.5)
nRBC: 0 % (ref 0.0–0.2)

## 2019-02-20 LAB — COMPREHENSIVE METABOLIC PANEL
ALT: 52 U/L — ABNORMAL HIGH (ref 0–44)
AST: 82 U/L — ABNORMAL HIGH (ref 15–41)
Albumin: 3.1 g/dL — ABNORMAL LOW (ref 3.5–5.0)
Alkaline Phosphatase: 54 U/L (ref 38–126)
Anion gap: 11 (ref 5–15)
BUN: 40 mg/dL — ABNORMAL HIGH (ref 8–23)
CO2: 21 mmol/L — ABNORMAL LOW (ref 22–32)
Calcium: 8.8 mg/dL — ABNORMAL LOW (ref 8.9–10.3)
Chloride: 114 mmol/L — ABNORMAL HIGH (ref 98–111)
Creatinine, Ser: 0.99 mg/dL (ref 0.44–1.00)
GFR calc Af Amer: 55 mL/min — ABNORMAL LOW (ref >60)
GFR calc non Af Amer: 48 mL/min — ABNORMAL LOW (ref >60)
Glucose, Bld: 194 mg/dL — ABNORMAL HIGH (ref 70–99)
Potassium: 4.1 mmol/L (ref 3.5–5.1)
Sodium: 146 mmol/L — ABNORMAL HIGH (ref 135–145)
Total Bilirubin: 1 mg/dL (ref 0.3–1.2)
Total Protein: 6.7 g/dL (ref 6.5–8.1)

## 2019-02-20 LAB — MAGNESIUM: Magnesium: 2.3 mg/dL (ref 1.7–2.4)

## 2019-02-20 LAB — D-DIMER, QUANTITATIVE: D-Dimer, Quant: 1.45 ug/mL-FEU — ABNORMAL HIGH (ref 0.00–0.50)

## 2019-02-20 LAB — C-REACTIVE PROTEIN: CRP: 2.3 mg/dL — ABNORMAL HIGH (ref <1.0)

## 2019-02-20 MED ORDER — LORAZEPAM 2 MG/ML IJ SOLN
1.0000 mg | INTRAMUSCULAR | Status: DC | PRN
  Administered 2019-02-20 – 2019-02-21 (×3): 1 mg via INTRAVENOUS
  Filled 2019-02-20 (×3): qty 1

## 2019-02-20 MED ORDER — MORPHINE SULFATE (PF) 2 MG/ML IV SOLN
1.0000 mg | INTRAVENOUS | Status: DC | PRN
  Administered 2019-02-20 – 2019-02-21 (×4): 1 mg via INTRAVENOUS
  Filled 2019-02-20 (×4): qty 1

## 2019-02-20 MED ORDER — LORAZEPAM 2 MG/ML PO CONC: 1.0000 mg | ORAL | Status: DC | PRN

## 2019-02-20 MED ORDER — POLYVINYL ALCOHOL 1.4 % OP SOLN
1.0000 [drp] | Freq: Four times a day (QID) | OPHTHALMIC | Status: DC | PRN
  Filled 2019-02-20: qty 15

## 2019-02-20 MED ORDER — BIOTENE DRY MOUTH MT LIQD: 15.0000 mL | OROMUCOSAL | Status: DC | PRN

## 2019-02-20 MED ORDER — LORAZEPAM 0.5 MG PO TABS: 1.0000 mg | ORAL_TABLET | ORAL | Status: DC | PRN

## 2019-02-20 MED ORDER — SCOPOLAMINE 1 MG/3DAYS TD PT72
1.0000 | MEDICATED_PATCH | TRANSDERMAL | Status: DC
  Administered 2019-02-20: 15:00:00 1.5 mg via TRANSDERMAL
  Filled 2019-02-20: qty 1

## 2019-02-20 MED ORDER — BISACODYL 10 MG RE SUPP: 10.0000 mg | Freq: Every day | RECTAL | Status: DC | PRN

## 2019-02-20 NOTE — Progress Notes (Signed)
Patient transitioned to comfort measures only.  Morphine and ativan administered PRN.  Patient is resting comfortably with eyes closed at this time.  Scopolamine patch behind left ear.  Q2H turning and oral care protocol followed.  Family updated on patient condition and plan of care.  Will continue to monitor.

## 2019-02-20 NOTE — Progress Notes (Signed)
PROGRESS NOTE                                                                                                                                                                                                             Patient Demographics:    Tracy Novak, is a 85 y.o. female, DOB - March 17, 1920, [id number]  Outpatient Primary MD for the patient is Venia Carbon, MD   Admit date - 02/16/2019   LOS - 4  No chief complaint on file.      Brief Narrative: Patient is a 85 y.o. female with PMHx of dementia, CVA, HTN, dyslipidemia-who presented with shortness of breath-found to have acute hypoxic respiratory failure secondary to COVID-19 pneumonia.  See below for further details   Subjective:    Tracy Novak today in bed appears confused, unable to answer questions or follow commands appropriately.   Assessment  & Plan :   Acute Hypoxic Resp Failure due to Covid 19 Viral pneumonia: She has moderate disease, started on IV steroids and remdesivir currently not severely hypoxic will monitor closely.   COVID-19 Labs: Recent Labs    02/18/19 0709 02/19/19 0055  DDIMER 1.89* 1.65*  CRP 4.6* 2.3*    No results found for: SARSCOV2NAA   COVID-19 Medications: Steroids: 11/5>> Remdesivir: 11/5>>    HTN: Controlled-monitor off antihypertensives  Advanced dementia: Supportive care-at risk for delirium.  CKD stage IIIb: Creatinine close to usual baseline-follow supportive care.  Hypokalemia.  Replaced.    Weakness and deconditioning.  She is a frail 85 year old female overall general prognosis remains very poor, discussed with son clearly that patient's overall prognosis is guarded, this discussion was on 02/18/2019.  Have given her 24 hours of IV fluids and supportive care without much improvement, at this time IV fluids will be stopped, unfortunately she did not do well on 02/20/2019 she looks more dehydrated after  stopping the IV fluids.  I do not think she is having any meaningful oral intake either.  Will transition to comfort care and hospice.  Palliative care will be involved.    Condition - Guarded  Family Communication  :    Son updated over the phone on 02/18/2019, DNR.  If declines focus on comfort.  Called son on his cell phone [phone]  Daughter in [place] and son again updated on 03/02/19 @ 10.10  am and 10:14 AM respectively  Code Status : DNR  Diet :  Diet Order            DIET DYS 3 Room service appropriate? Yes; Fluid consistency: Thin  Diet effective now               Disposition Plan  :  Res.Hospice  Barriers to discharge: Hypoxia requiring O2 supplementation/complete 5 days of IV Remdesivir  Consults  :  None  Procedures  :  None  Antibiotics  :    Anti-infectives (From admission, onward)   Start     Dose/Rate Route Frequency Ordered Stop   02/18/19 1600  remdesivir 100 mg in sodium chloride 0.9 % 250 mL IVPB     100 mg 500 mL/hr over 30 Minutes Intravenous Every 24 hours 02/16/19 2322 02/22/19 1559   02/17/19 0000  remdesivir 200 mg in sodium chloride 0.9 % 250 mL IVPB     200 mg 500 mL/hr over 30 Minutes Intravenous Once 02/16/19 2322 02/17/19 0045      DVT Prophylaxis  :  Heparin   Inpatient Medications  Scheduled Meds: . dexamethasone (DECADRON) injection  6 mg Intravenous Q24H  . feeding supplement (ENSURE ENLIVE)  237 mL Oral TID BM  . heparin injection (subcutaneous)  5,000 Units Subcutaneous Q8H  . vitamin C  500 mg Oral Daily  . zinc sulfate  220 mg Oral Daily   Continuous Infusions: . remdesivir 100 mg in NS 250 mL 100 mg (02/19/19 1840)   PRN Meds:.acetaminophen, guaiFENesin-dextromethorphan, haloperidol lactate, morphine injection, [DISCONTINUED] ondansetron **OR** ondansetron (ZOFRAN) IV   Time Spent in minutes  25  See all Orders from today for further details   Tracy Novak M.D on 02/20/2019 at 10:09 AM  To page go to  [email or web address] - use universal password  [facility] -  Office  [phone]    Objective:   Vitals:   02/19/19 1531 02/19/19 2000 02/20/19 0544 02/20/19 0758  BP: 137/90 (!) 107/58 122/60 104/64  Pulse: 76 67 84 92  Resp:  (!) 22  20  Temp: 98 F (36.7 C) 99.3 F (37.4 C) 98.7 F (37.1 C) 98.8 F (37.1 C)  TempSrc: Axillary Axillary Axillary Axillary  SpO2: 97% 94% 93% 97%  Weight:      Height:        Wt Readings from Last 3 Encounters:  02/16/19 58.6 kg  12/10/17 62.1 kg  07/12/17 63.5 kg     Intake/Output Summary (Last 24 hours) at 02/20/2019 1009 Last data filed at 02/20/2019 0830 Gross per 24 hour  Intake 0 ml  Output 2000 ml  Net -2000 ml     Physical Exam  Awake but confused, unable to answer questions appropriately or follow commands, [place].AT,PERRAL Supple Neck,No JVD, No cervical lymphadenopathy appriciated.  Symmetrical Chest wall movement, Good air movement bilaterally, CTAB RRR,No Gallops, Rubs or new Murmurs, No Parasternal Heave +ve B.Sounds, Abd Soft, No tenderness, No organomegaly appriciated, No rebound - guarding or rigidity. No Cyanosis, Clubbing or edema, No new Rash or bruise    Data Review:    CBC Recent Labs  Lab 02/16/19 1850 02/17/19 0525 02/18/19 0709 02/19/19 0055  WBC 5.5 2.3* 4.7 4.9  HGB 13.3 11.7* 12.4 11.2*  HCT 41.5 36.3 37.3 35.2*  PLT 145* 151 207 188  MCV 95.2 93.3 92.3 94.9  MCH 30.5 30.1 30.7 30.2  MCHC 32.0 32.2 33.2 31.8  RDW 13.5 13.4 13.5 13.6  LYMPHSABS 1.0 0.6* 1.0 0.6*  MONOABS 1.7* 0.3 0.6 0.6  EOSABS 0.0 0.0 0.0 0.0  BASOSABS 0.0 0.0 0.0 0.0    Chemistries  Recent Labs  Lab 02/16/19 1850 02/17/19 0525 02/18/19 0709 02/19/19 0055  NA 135 139 142 143  K 3.0* 3.1* 3.4* 3.9  CL 103 105 109 112*  CO2 21* 20* 21* 22  GLUCOSE 120* 179* 183* 190*  BUN 49* 45* 53* 44*  CREATININE 1.54* 1.36* 1.11* 1.11*  CALCIUM 8.0* 7.9* 8.5* 8.3*  AST 73* 63* 51* 40  ALT 30 28 29 23   ALKPHOS 54 49 48  40  BILITOT 0.6 0.7 0.6 0.7   ------------------------------------------------------------------------------------------------------------------ No results for input(s): CHOL, HDL, LDLCALC, TRIG, CHOLHDL, LDLDIRECT in the last 72 hours.  No results found for: HGBA1C ------------------------------------------------------------------------------------------------------------------ No results for input(s): TSH, T4TOTAL, T3FREE, THYROIDAB in the last 72 hours.  Invalid input(s): FREET3 ------------------------------------------------------------------------------------------------------------------ No results for input(s): VITAMINB12, FOLATE, FERRITIN, TIBC, IRON, RETICCTPCT in the last 72 hours.  Coagulation profile No results for input(s): INR, PROTIME in the last 168 hours.  Recent Labs    02/18/19 0709 02/19/19 0055  DDIMER 1.89* 1.65*    Cardiac Enzymes No results for input(s): CKMB, TROPONINI, MYOGLOBIN in the last 168 hours.  Invalid input(s): CK ------------------------------------------------------------------------------------------------------------------    Component Value Date/Time   BNP 66.3 02/16/2019 1850    Micro Results No results found for this or any previous visit (from the past 240 hour(s)).  Radiology Reports Portable Chest 1 View  Result Date: 02/16/2019 CLINICAL DATA:  Shortness of breath EXAM: PORTABLE CHEST 1 VIEW COMPARISON:  06/21/2007 FINDINGS: Patchy opacities in both lower lungs, new since prior study. Heart is normal size. No effusions. No acute bony abnormality. IMPRESSION: Patchy bibasilar opacities/infiltrates. Electronically Signed   By: Rolm Baptise M.D.   On: 02/16/2019 21:17   Dg Abd Portable 1v  Result Date: 02/18/2019 CLINICAL DATA:  Abdominal pain. EXAM: PORTABLE ABDOMEN - 1 VIEW COMPARISON:  09/30/2010 FINDINGS: Bowel gas pattern is nonobstructive. There is no free peritoneal air. Curvature of the lumbar spine convex left unchanged.  Degenerative changes of the spine and hips. IMPRESSION: Nonobstructive bowel gas pattern. Electronically Signed   By: Marin Olp M.D.   On: 02/18/2019 10:38

## 2019-02-20 NOTE — Progress Notes (Signed)
  Speech Language Pathology Treatment: Dysphagia  Patient Details Name: Tracy Novak MRN: [id number] DOB: 07-May-1919 Today's Date: 02/20/2019 Time: [id number] SLP Time Calculation (min) (ACUTE ONLY): 11 min  Assessment / Plan / Recommendation Clinical Impression  Pt has consumed minimal POs per review  Of chart and discussion with RN. Her oral hygiene is reduced - question if she is allowing much oral care to be performed. With Max cues she took a bite of applesauce and two small sips of juice with no overt s/s of aspiration, but she would not take any more. Pt is at risk of inadequate nutrition/hydration, although this is more related to her cognitive status as opposed to an overt swallowing impairment (although her mentation does put her at risk for aspiration). Note that palliative care consult has been ordered. Will f/u as appropriate.   HPI HPI: 85 yo female admitted with worsening SOB, PNA, tested positive for COVID-19 on 10/28. PMH includes dementia, HLD, HTN, CVA.       SLP Plan  Continue with current plan of care       Recommendations  Diet recommendations: Dysphagia 3 (mechanical soft);Thin liquid Liquids provided via: Cup;Straw;Teaspoon Medication Administration: Crushed with puree Supervision: Staff to assist with self feeding;Full supervision/cueing for compensatory strategies Compensations: Slow rate;Small sips/bites Postural Changes and/or Swallow Maneuvers: Seated upright 90 degrees                Oral Care Recommendations: Oral care QID Follow up Recommendations: Skilled Nursing facility SLP Visit Diagnosis: Dysphagia, unspecified (R13.10) Plan: Continue with current plan of care       GO                Venita Sheffield Aldine Chakraborty 02/20/2019, 11:59 AM  Pollyann Glen, M.A. [place] Acute Environmental education officer [phone] Office [phone]

## 2019-02-21 LAB — C-REACTIVE PROTEIN: CRP: 2.4 mg/dL — ABNORMAL HIGH (ref <1.0)

## 2019-02-21 LAB — COMPREHENSIVE METABOLIC PANEL
ALT: 58 U/L — ABNORMAL HIGH (ref 0–44)
AST: 77 U/L — ABNORMAL HIGH (ref 15–41)
Albumin: 3 g/dL — ABNORMAL LOW (ref 3.5–5.0)
Alkaline Phosphatase: 49 U/L (ref 38–126)
Anion gap: 14 (ref 5–15)
BUN: 42 mg/dL — ABNORMAL HIGH (ref 8–23)
CO2: 20 mmol/L — ABNORMAL LOW (ref 22–32)
Calcium: 8.7 mg/dL — ABNORMAL LOW (ref 8.9–10.3)
Chloride: 116 mmol/L — ABNORMAL HIGH (ref 98–111)
Creatinine, Ser: 1 mg/dL (ref 0.44–1.00)
GFR calc Af Amer: 55 mL/min — ABNORMAL LOW (ref >60)
GFR calc non Af Amer: 47 mL/min — ABNORMAL LOW (ref >60)
Glucose, Bld: 144 mg/dL — ABNORMAL HIGH (ref 70–99)
Potassium: 3.6 mmol/L (ref 3.5–5.1)
Sodium: 150 mmol/L — ABNORMAL HIGH (ref 135–145)
Total Bilirubin: 1.3 mg/dL — ABNORMAL HIGH (ref 0.3–1.2)
Total Protein: 6.2 g/dL — ABNORMAL LOW (ref 6.5–8.1)

## 2019-02-21 LAB — CBC WITH DIFFERENTIAL/PLATELET
Abs Immature Granulocytes: 0.44 10*3/uL — ABNORMAL HIGH (ref 0.00–0.07)
Basophils Absolute: 0.1 10*3/uL (ref 0.0–0.1)
Basophils Relative: 1 %
Eosinophils Absolute: 0 10*3/uL (ref 0.0–0.5)
Eosinophils Relative: 0 %
HCT: 41.4 % (ref 36.0–46.0)
Hemoglobin: 12.8 g/dL (ref 12.0–15.0)
Immature Granulocytes: 5 %
Lymphocytes Relative: 7 %
Lymphs Abs: 0.6 10*3/uL — ABNORMAL LOW (ref 0.7–4.0)
MCH: 29.8 pg (ref 26.0–34.0)
MCHC: 30.9 g/dL (ref 30.0–36.0)
MCV: 96.3 fL (ref 80.0–100.0)
Monocytes Absolute: 1.3 10*3/uL — ABNORMAL HIGH (ref 0.1–1.0)
Monocytes Relative: 14 %
Neutro Abs: 6.7 10*3/uL (ref 1.7–7.7)
Neutrophils Relative %: 73 %
Platelets: 298 10*3/uL (ref 150–400)
RBC: 4.3 MIL/uL (ref 3.87–5.11)
RDW: 13.7 % (ref 11.5–15.5)
WBC: 9.1 10*3/uL (ref 4.0–10.5)
nRBC: 0 % (ref 0.0–0.2)

## 2019-02-21 LAB — D-DIMER, QUANTITATIVE: D-Dimer, Quant: 1.58 ug/mL-FEU — ABNORMAL HIGH (ref 0.00–0.50)

## 2019-02-21 NOTE — Discharge Summary (Signed)
Tracy Novak [id number] DOB: August 10, 1919 DOA: 02/16/2019  PCP: Venia Carbon, MD  Admit date: 02/16/2019  Discharge date: 02/21/2019  Admitted From: Home  Disposition:  Res.Hospice   Recommendations for Outpatient Follow-up:   Follow up with PCP in 1-2 weeks  PCP Please obtain BMP/CBC, 2 view CXR in 1week,  (see Discharge instructions)   PCP Please follow up on the following pending results:    Home Health: None   Equipment/Devices: None  Consultations: None  Discharge Condition: Guarded  CODE STATUS: Full    Diet Recommendation: Dysphagia 3 diet with feeding assistance and aspiration precautions for comfort only  Diet Order            DIET DYS 3 Room service appropriate? Yes; Fluid consistency: Thin  Diet effective now               CC - weakness   Brief history of present illness from the day of admission and additional interim summary     Patient is a 85 y.o. female with PMHx of dementia, CVA, HTN, dyslipidemia-who presented with shortness of breath-found to have acute hypoxic respiratory failure secondary to COVID-19 pneumonia.  Now on full comfort measures.  Awaiting residential hospice.                                                                 Hospital Course   Acute Hypoxic Resp Failure due to Covid 19 Viral pneumonia: Had moderate disease and was treated with IV steroids and remdesivir, her pulmonary status was stable however she had considerable overall decline hence this treatment was stopped and she was transitioned to full comfort measures.  Goal of care now remains comfort only.  All unnecessary medications stopped.  Use comfort medications at residential hospice as needed.  Bed arranged today.  Son updated over the phone on 02/18/2019, DNR.  If declines focus on comfort.   Called son on his cell phone [phone]  Daughter in [place] Cramp and son again updated on 03/02/19 @ 10.10 am and 10:14 AM respectively  Discharge diagnosis     Principal Problem:   Pneumonia due to COVID-19 virus Active Problems:   Hyperlipemia   Essential hypertension, benign   GERD   Vascular dementia without behavioral disturbance (HCC)   CKD (chronic kidney disease) stage 3, GFR 30-59 ml/min   Acute respiratory failure due to COVID-19 [facility])    Discharge instructions    Discharge Instructions    Discharge instructions   Complete by: As directed    Disposition.  Residential hospice Condition.  Guarded CODE STATUS.  DNR Activity.  With assistance as tolerated, full fall precautions. Diet.  Soft with feeding assistance and aspiration precautions. Goal of care.  Comfort.      Discharge Medications   Allergies as of 02/21/2019  Reactions   Cephalexin Rash   " peeled my skin off" per patient   Haloperidol Lactate Other (See Comments)   REACTION: unspecified   Hydrocodone-acetaminophen    REACTION: nausea   Penicillins    REACTION: unspecified   Sulfonamide Derivatives    REACTION: unspecified      Medication List    STOP taking these medications   acetaminophen 500 MG tablet Commonly known as: TYLENOL   amLODipine 10 MG tablet Commonly known as: NORVASC   amLODipine 5 MG tablet Commonly known as: NORVASC   aspirin 81 MG tablet   dexamethasone 6 MG tablet Commonly known as: DECADRON   furosemide 20 MG tablet Commonly known as: LASIX         Major procedures and Radiology Reports - PLEASE review detailed and final reports thoroughly  -         Portable Chest 1 View  Result Date: 02/16/2019 CLINICAL DATA:  Shortness of breath EXAM: PORTABLE CHEST 1 VIEW COMPARISON:  06/21/2007 FINDINGS: Patchy opacities in both lower lungs, new since prior study. Heart is normal size. No effusions. No acute bony abnormality. IMPRESSION: Patchy bibasilar  opacities/infiltrates. Electronically Signed   By: Rolm Baptise M.D.   On: 02/16/2019 21:17   Dg Abd Portable 1v  Result Date: 02/18/2019 CLINICAL DATA:  Abdominal pain. EXAM: PORTABLE ABDOMEN - 1 VIEW COMPARISON:  09/30/2010 FINDINGS: Bowel gas pattern is nonobstructive. There is no free peritoneal air. Curvature of the lumbar spine convex left unchanged. Degenerative changes of the spine and hips. IMPRESSION: Nonobstructive bowel gas pattern. Electronically Signed   By: Marin Olp M.D.   On: 02/18/2019 10:38    Micro Results     No results found for this or any previous visit (from the past 240 hour(s)).  Today   Subjective    Tracy Novak today pains in bed appears to be in no distress but unable to answer questions.   Objective   Blood pressure (!) 134/92, pulse 80, temperature 98.1 F (36.7 C), temperature source Axillary, resp. rate 18, height 5\' 1"  (1.549 m), weight 58.6 kg, SpO2 93 %.   Intake/Output Summary (Last 24 hours) at 02/21/2019 1215 Last data filed at 02/20/2019 1837 Gross per 24 hour  Intake 0 ml  Output -  Net 0 ml    Exam Patient in bed, awake but unable to answer questions or follow commands [name].AT,PERRAL Supple Neck,No JVD, No cervical lymphadenopathy appriciated.  Symmetrical Chest wall movement, Good air movement bilaterally, CTAB RRR,No Gallops,Rubs or new Murmurs, No Parasternal Heave +ve B.Sounds, Abd Soft, Non tender, No organomegaly appriciated, No rebound -guarding or rigidity. No Cyanosis, Clubbing or edema, No new Rash or bruise   Data Review   CBC w Diff:  Lab Results  Component Value Date   WBC 9.1 02/21/2019   HGB 12.8 02/21/2019   HCT 41.4 02/21/2019   PLT 298 02/21/2019   LYMPHOPCT 7 02/21/2019   MONOPCT 14 02/21/2019   EOSPCT 0 02/21/2019   BASOPCT 1 02/21/2019    CMP:  Lab Results  Component Value Date   NA 150 (H) 02/21/2019   K 3.6 02/21/2019   CL 116 (H) 02/21/2019   CO2 20 (L) 02/21/2019   BUN 42 (H) 02/21/2019    CREATININE 1.00 02/21/2019   PROT 6.2 (L) 02/21/2019   ALBUMIN 3.0 (L) 02/21/2019   BILITOT 1.3 (H) 02/21/2019   ALKPHOS 49 02/21/2019   AST 77 (H) 02/21/2019   ALT 58 (H) 02/21/2019  .  Total Time in preparing paper work, data evaluation and todays exam - 52 minutes  Lala Lund M.D on 02/21/2019 at 12:15 PM  [facility]   Office  [phone]

## 2019-02-21 NOTE — Progress Notes (Signed)
Report called to [facility] at [facility].  All questions welcomed and answered.

## 2019-02-21 NOTE — TOC Transition Note (Signed)
Transition of Care [facility]) - CM/SW Discharge Note   Patient Details  Name: Tracy Novak MRN: [id number] Date of Birth: [birth year]  Transition of Care [facility]) CM/SW Contact:  Weston Anna, LCSW Phone Number: 02/21/2019, 2:08 PM   Clinical Narrative:     Patient set to discharge to [facility] today- please call report to [phone]. Son, [name], aware and completing all needed paperwork for facility. PTAR has been scheduled for 3:30PM.   Final next level of care: [place] Barriers to Discharge: No Barriers Identified   Patient Goals and CMS Choice        Discharge Placement              Patient chooses bed at: Other - please specify in the comment section below:([facility]) Patient to be transferred to facility by: [place] Name of family member notified: Son-thomas Patient and family notified of of transfer: 02/21/19  Discharge Plan and Services In-house Referral: Clinical Social Work              DME Arranged: N/A         HH Arranged: NA          Social Determinants of Health (SDOH) Interventions     Readmission Risk Interventions Readmission Risk Prevention Plan 02/17/2019  Medication Screening Complete  Transportation Screening Complete  Some recent data might be hidden

## 2019-02-21 NOTE — Progress Notes (Signed)
Palliative Medicine RN Note: Due to high consult volume, there may be a delay seeing this patient. Our NP Vinie Sill spoke with Dr Candiss Norse to let him know; pt is already comfort care with order for residential hospice. He asked for help making sure this referral is completed.  [facility] is the only [facility] facility taking COVID + patients. I have left a message for their liaison Cheri.  Marjie Skiff  [place], RN, BSN, [facility] Palliative Medicine Team 02/21/2019 10:20 AM Office [phone]

## 2019-02-21 NOTE — Progress Notes (Signed)
PROGRESS NOTE                                                                                                                                                                                                             Patient Demographics:    Tracy Novak, is a 85 y.o. female, DOB - [birth year], [id number]  Outpatient Primary MD for the patient is Venia Carbon, MD   Admit date - 02/16/2019   LOS - 5  No chief complaint on file.      Brief Narrative: Patient is a 85 y.o. female with PMHx of dementia, CVA, HTN, dyslipidemia-who presented with shortness of breath-found to have acute hypoxic respiratory failure secondary to COVID-19 pneumonia.  Now on full comfort measures.  Awaiting residential hospice.   Subjective:    Tracy Novak today in bed appears confused, unable to answer questions or follow commands appropriately.   Assessment  & Plan :   Acute Hypoxic Resp Failure due to Covid 19 Viral pneumonia: Had moderate disease and was treated with IV steroids and remdesivir, her pulmonary status was stable however she had considerable overall decline hence this treatment was stopped and she was transitioned to full comfort measures.  COVID-19 Labs: Recent Labs    02/19/19 0055 02/20/19 0842 02/21/19 0040  DDIMER 1.65* 1.45* 1.58*  CRP 2.3* 2.3* 2.4*    No results found for: SARSCOV2NAA   COVID-19 Medications: Steroids: 11/5>> Remdesivir: 11/5>>    HTN: Controlled-monitor off antihypertensives  Advanced dementia: Supportive care-at risk for delirium.  CKD stage IIIb: Creatinine close to usual baseline-follow supportive care.  Hypokalemia.  Replaced.    Weakness and deconditioning.  She is a frail 85 year old female overall general prognosis remains very poor, discussed with son clearly that patient's overall prognosis is guarded, this discussion was on 02/18/2019.  Have given her 24 hours of IV fluids and  supportive care without much improvement, at this time IV fluids will be stopped, unfortunately she did not do well on 02/20/2019 she looks more dehydrated after stopping the IV fluids.  I do not think she is having any meaningful oral intake either.  Will transition to comfort care and hospice.  Palliative care will be involved.    Condition - Guarded  Family Communication  :    Son updated over the phone on 02/18/2019, DNR.  If declines focus on comfort.  Called son on his cell phone [phone]  Daughter in [place] Cramp and son again updated on 03/02/19 @ 10.10 am and 10:14 AM respectively  Code Status : DNR now full comfort measures.  Diet :  Diet Order            DIET DYS 3 Room service appropriate? Yes; Fluid consistency: Thin  Diet effective now               Disposition Plan  :  Res.Hospice  Consults  : Palliative care over the phone  Procedures  :  None  Antibiotics  :    Anti-infectives (From admission, onward)   Start     Dose/Rate Route Frequency Ordered Stop   02/18/19 1600  remdesivir 100 mg in sodium chloride 0.9 % 250 mL IVPB  Status:  Discontinued     100 mg 500 mL/hr over 30 Minutes Intravenous Every 24 hours 02/16/19 2322 02/20/19 1018   02/17/19 0000  remdesivir 200 mg in sodium chloride 0.9 % 250 mL IVPB     200 mg 500 mL/hr over 30 Minutes Intravenous Once 02/16/19 2322 02/17/19 0045      DVT Prophylaxis  :  Heparin has now stopped due to comfort measures.  Inpatient Medications  Scheduled Meds: . scopolamine  1 patch Transdermal Q72H   Continuous Infusions:  PRN Meds:.acetaminophen, antiseptic oral rinse, bisacodyl, guaiFENesin-dextromethorphan, haloperidol lactate, LORazepam **OR** LORazepam **OR** LORazepam, morphine injection, [DISCONTINUED] ondansetron **OR** ondansetron (ZOFRAN) IV, polyvinyl alcohol   Time Spent in minutes  25  See all Orders from today for further details   Lala Lund M.D on 02/21/2019 at 10:47 AM  To page go to  [email or web address] - use universal password  [facility] -  Office  [phone]    Objective:   Vitals:   02/20/19 0544 02/20/19 0758 02/20/19 2038 02/21/19 0402  BP: 122/60 104/64 (!) 128/95 (!) 134/92  Pulse: 84 92 (!) 105 80  Resp:  20 18 18   Temp: 98.7 F (37.1 C) 98.8 F (37.1 C) 98.4 F (36.9 C) 98.1 F (36.7 C)  TempSrc: Axillary Axillary Axillary Axillary  SpO2: 93% 97% 90% 93%  Weight:      Height:        Wt Readings from Last 3 Encounters:  02/16/19 58.6 kg  12/10/17 62.1 kg  07/12/17 63.5 kg     Intake/Output Summary (Last 24 hours) at 02/21/2019 1047 Last data filed at 02/20/2019 1837 Gross per 24 hour  Intake 0 ml  Output -  Net 0 ml     Physical Exam  Awake but confused, unable to answer questions appropriately or follow commands, [place].AT,PERRAL Supple Neck,No JVD, No cervical lymphadenopathy appriciated.  Symmetrical Chest wall movement, Good air movement bilaterally, CTAB RRR,No Gallops, Rubs or new Murmurs, No Parasternal Heave +ve B.Sounds, Abd Soft, No tenderness, No organomegaly appriciated, No rebound - guarding or rigidity. No Cyanosis, Clubbing or edema, No new Rash or bruise    Data Review:    CBC Recent Labs  Lab 02/17/19 0525 02/18/19 0709 02/19/19 0055 02/20/19 0842 02/21/19 0040  WBC 2.3* 4.7 4.9 9.2 9.1  HGB 11.7* 12.4 11.2* 13.2 12.8  HCT 36.3 37.3 35.2* 41.5 41.4  PLT 151 207 188 303 298  MCV 93.3 92.3 94.9 95.2 96.3  MCH 30.1 30.7 30.2 30.3 29.8  MCHC 32.2 33.2 31.8 31.8 30.9  RDW 13.4 13.5 13.6 13.7 13.7  LYMPHSABS 0.6* 1.0 0.6* 0.8 0.6*  MONOABS 0.3 0.6  0.6 0.9 1.3*  EOSABS 0.0 0.0 0.0 0.0 0.0  BASOSABS 0.0 0.0 0.0 0.1 0.1    Chemistries  Recent Labs  Lab 02/17/19 0525 02/18/19 0709 02/19/19 0055 02/20/19 0842 02/21/19 0040  NA 139 142 143 146* 150*  K 3.1* 3.4* 3.9 4.1 3.6  CL 105 109 112* 114* 116*  CO2 20* 21* 22 21* 20*  GLUCOSE 179* 183* 190* 194* 144*  BUN 45* 53* 44* 40* 42*  CREATININE  1.36* 1.11* 1.11* 0.99 1.00  CALCIUM 7.9* 8.5* 8.3* 8.8* 8.7*  MG  --   --   --  2.3  --   AST 63* 51* 40 82* 77*  ALT 28 29 23  52* 58*  ALKPHOS 49 48 40 54 49  BILITOT 0.7 0.6 0.7 1.0 1.3*   ------------------------------------------------------------------------------------------------------------------ No results for input(s): CHOL, HDL, LDLCALC, TRIG, CHOLHDL, LDLDIRECT in the last 72 hours.  No results found for: HGBA1C ------------------------------------------------------------------------------------------------------------------ No results for input(s): TSH, T4TOTAL, T3FREE, THYROIDAB in the last 72 hours.  Invalid input(s): FREET3 ------------------------------------------------------------------------------------------------------------------ No results for input(s): VITAMINB12, FOLATE, FERRITIN, TIBC, IRON, RETICCTPCT in the last 72 hours.  Coagulation profile No results for input(s): INR, PROTIME in the last 168 hours.  Recent Labs    02/20/19 0842 02/21/19 0040  DDIMER 1.45* 1.58*    Cardiac Enzymes No results for input(s): CKMB, TROPONINI, MYOGLOBIN in the last 168 hours.  Invalid input(s): CK ------------------------------------------------------------------------------------------------------------------    Component Value Date/Time   BNP 66.3 02/16/2019 1850    Micro Results No results found for this or any previous visit (from the past 240 hour(s)).  Radiology Reports Portable Chest 1 View  Result Date: 02/16/2019 CLINICAL DATA:  Shortness of breath EXAM: PORTABLE CHEST 1 VIEW COMPARISON:  06/21/2007 FINDINGS: Patchy opacities in both lower lungs, new since prior study. Heart is normal size. No effusions. No acute bony abnormality. IMPRESSION: Patchy bibasilar opacities/infiltrates. Electronically Signed   By: Rolm Baptise M.D.   On: 02/16/2019 21:17   Dg Abd Portable 1v  Result Date: 02/18/2019 CLINICAL DATA:  Abdominal pain. EXAM: PORTABLE ABDOMEN  - 1 VIEW COMPARISON:  09/30/2010 FINDINGS: Bowel gas pattern is nonobstructive. There is no free peritoneal air. Curvature of the lumbar spine convex left unchanged. Degenerative changes of the spine and hips. IMPRESSION: Nonobstructive bowel gas pattern. Electronically Signed   By: Marin Olp M.D.   On: 02/18/2019 10:38

## 2019-02-21 NOTE — Discharge Instructions (Signed)
Disposition.  Residential hospice °Condition.  Guarded °CODE STATUS.  DNR °Activity.  With assistance as tolerated, full fall precautions. °Diet.  Soft with feeding assistance and aspiration precautions. °Goal of care.  Comfort. ° °

## 2019-02-21 NOTE — Care Management Important Message (Signed)
Important Message  Patient Details  Name: TALULAH FIEL MRN: [id number] Date of Birth: April 25, 1919   Medicare Important Message Given:  Yes - Important Message mailed due to current National Emergency  Verbal consent obtained due to current National Emergency  Relationship to patient: Child Contact Name: Devaya Veltri Call Date: 02/21/19  Time: 1442 Phone: [phone] Outcome: Spoke with contact Important Message mailed to: Other (must enter comment)(emailed to [email or web address])    Stran Raper P Jermichael [place]mares 02/21/2019, 2:50 PM

## 2019-02-21 NOTE — Progress Notes (Signed)
Report given to PTAR transport.  Patient medicated with 1 mg of morphine for comfort prior to transport.

## 2019-02-21 NOTE — Progress Notes (Signed)
[facility] [place]:  [employer] [facility] to both children [name] and [place]. Discussed hospice services confirmed the [facility] of choice. They are in agreement to comfort care and have accepted the bed offer.  Notified Marjory Lies and she will arrange ambulance after MD discharges pt.   Webb Silversmith RN [phone]

## 2019-03-14 DEATH — deceased

## 2019-03-20 ENCOUNTER — Telehealth: Payer: Self-pay

## 2019-03-20 NOTE — Telephone Encounter (Signed)
I spoke with pt's son,Tom Elgie Congo and he was returning a call from DR Silvio Pate about his mother Tracy Novak who was a resident at [facility] skilled division and passed away on 03-01-19. Mr Arrell appreciates how caring Dr Silvio Pate was to his mother  note also tranferred to [facility] chart.   [place] Night - Client Nonclinical Telephone Record AccessNurse Client [place] Night - Client Client Site [place] Physician Viviana Simpler - MD Contact Type Call Who Is Calling Patient / Member / Family / Caregiver Caller Name Tracy Novak Caller Phone Number [phone] Call Type Message Only Information Provided Reason for Call Returning a Call from the Office Initial [place] states his doctor was trying to get in touch with him and he was returning the call. Additional Comment Provided caller with office hours from profile. Disp. Time Disposition Final User 03/18/2019 11:53:24 AM General Information Provided Yes Hassie Bruce

## 2019-03-20 NOTE — Telephone Encounter (Signed)
Yes, I did eventually get to speak to him about her death and offer my condolences

## 2020-02-02 IMAGING — DX DG ABD PORTABLE 1V
1 series · 2 of 2 positions shown · non-contrast
Comparison: 09/30/2010

CLINICAL DATA: Abdominal pain.

EXAM:
PORTABLE ABDOMEN - 1 VIEW

[Series 1: abdomen · 0.14mm/px · 2 of 2 slices shown]
[im 1/2]
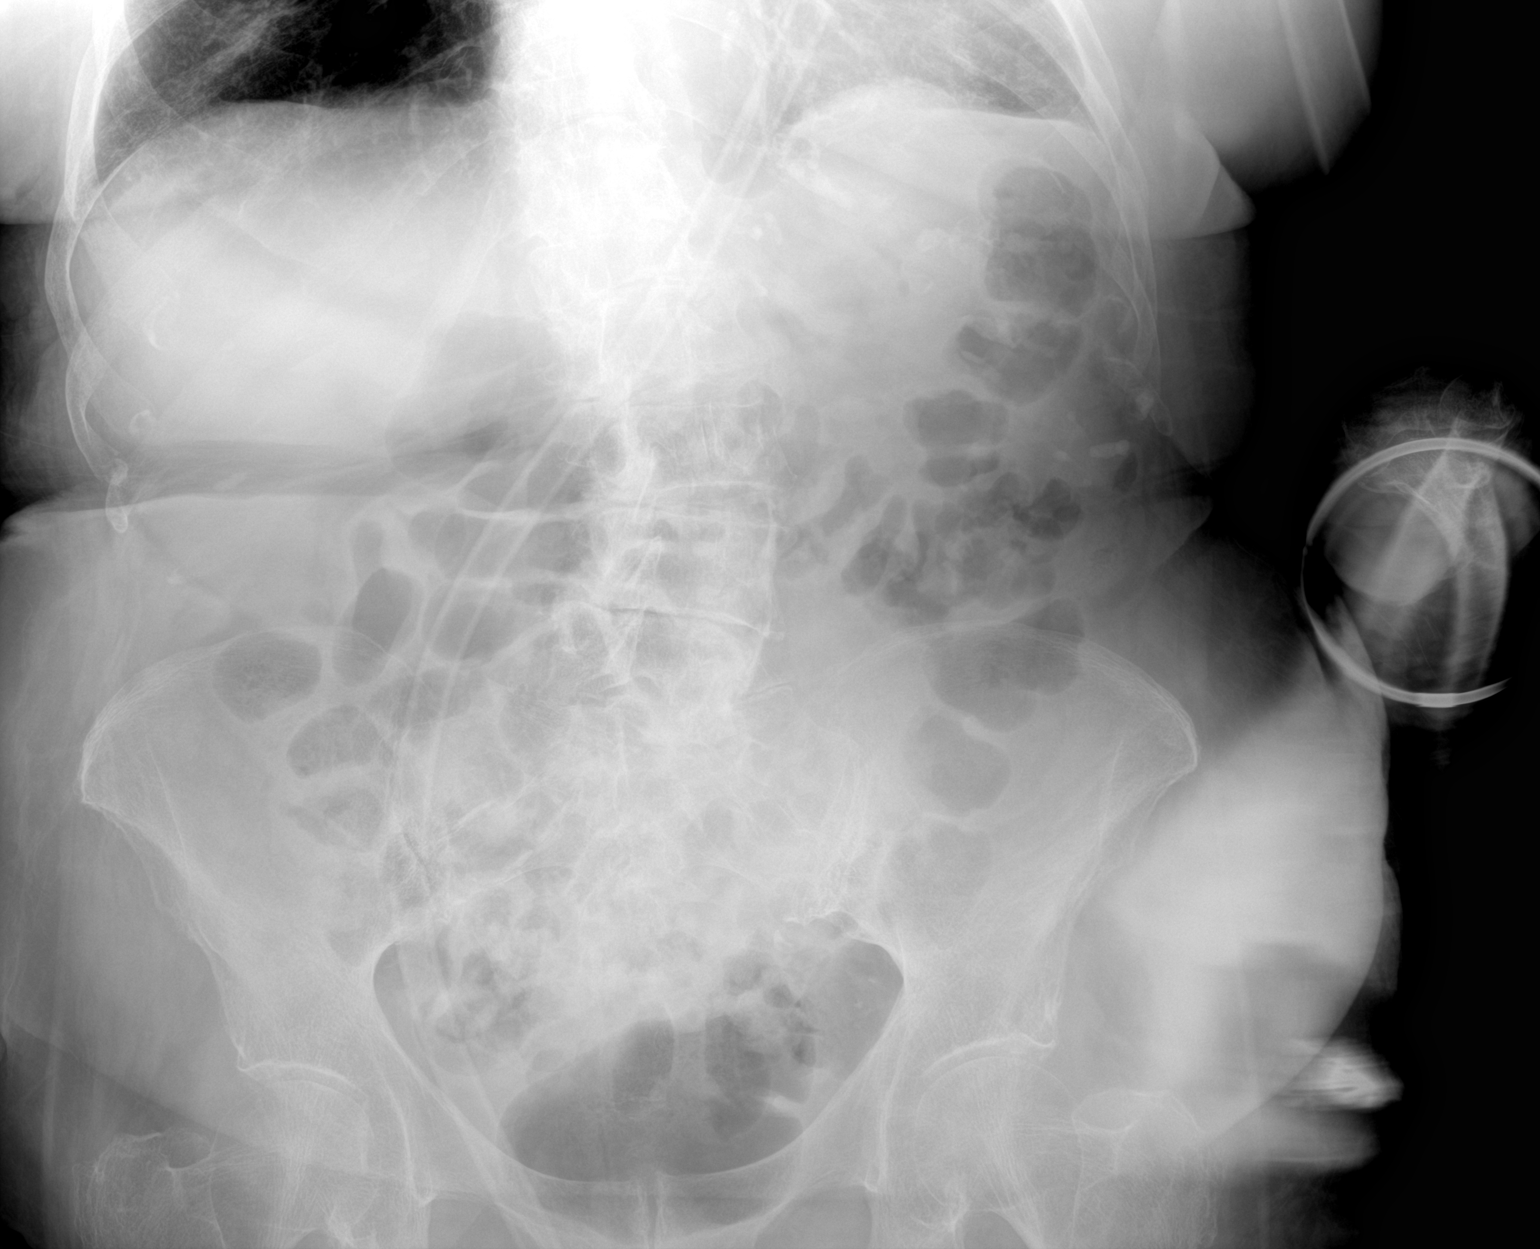
[im 2/2]
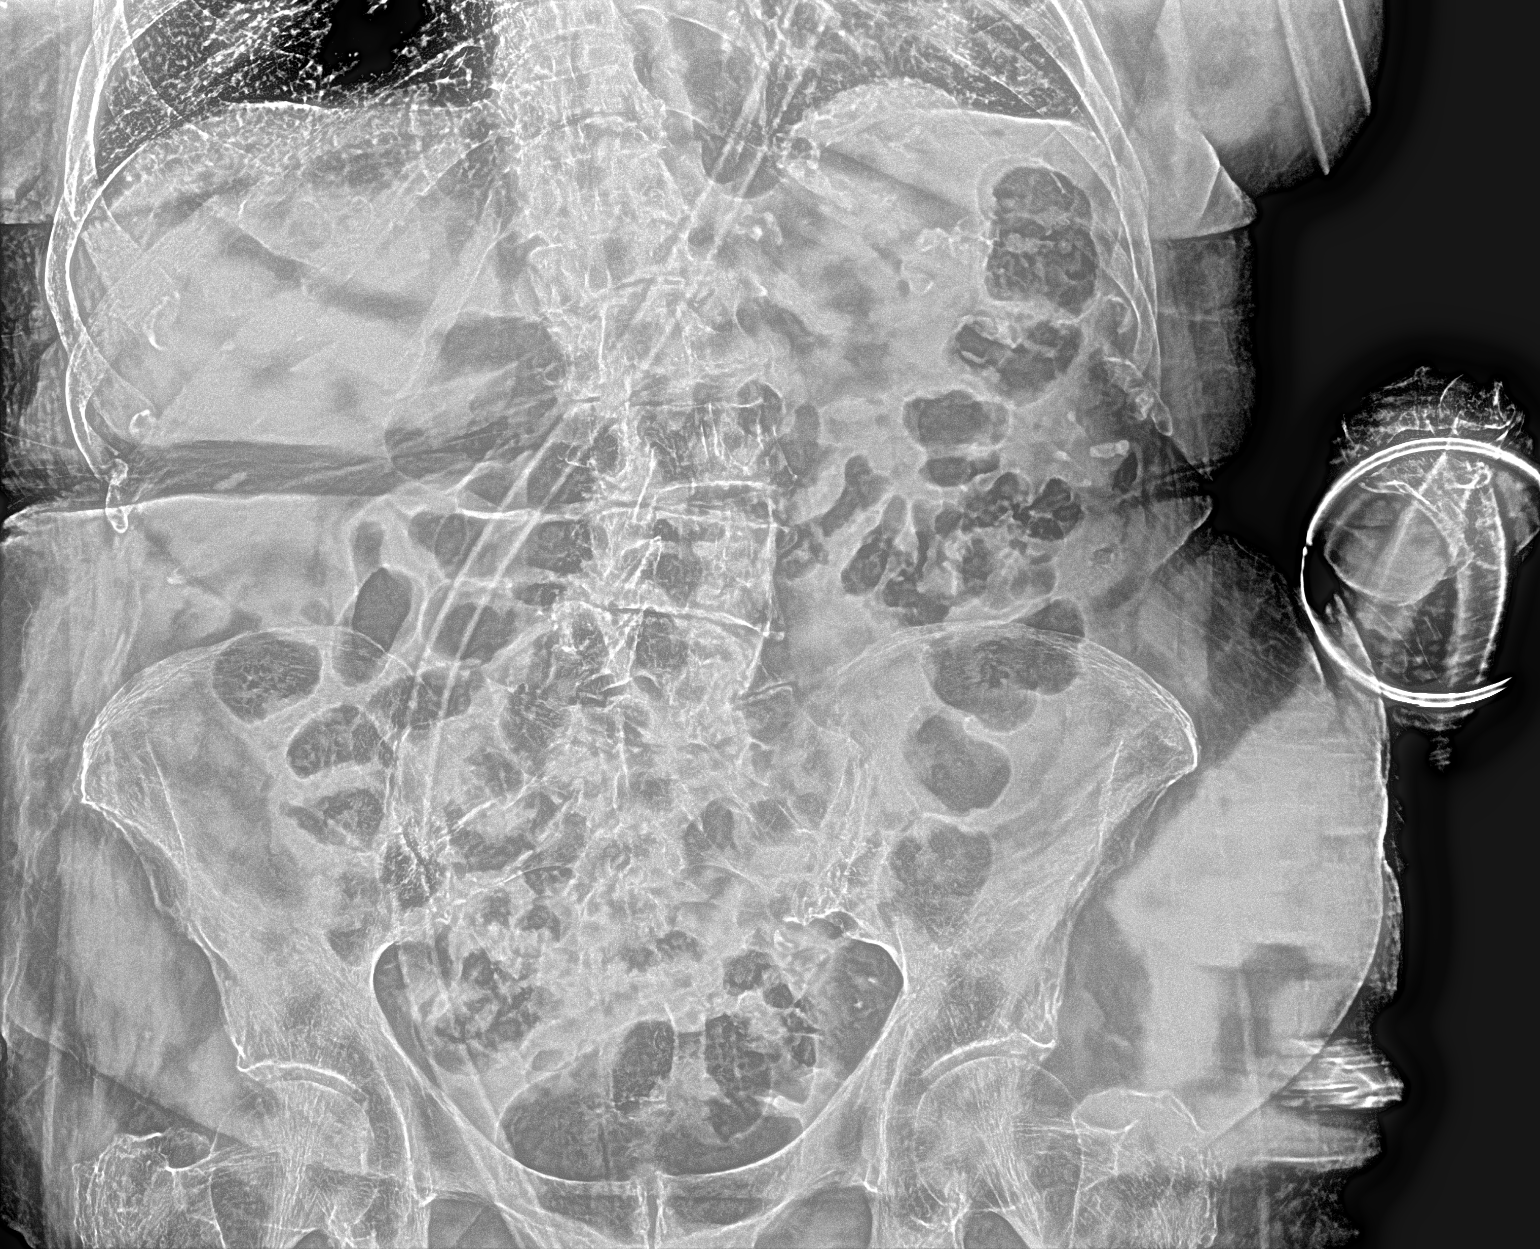

[2 of 2 positions shown; findings below may reference images not displayed]

FINDINGS: Bowel gas pattern is nonobstructive. There is no free peritoneal
air. Curvature of the lumbar spine convex left unchanged.
Degenerative changes of the spine and hips.
IMPRESSION: Nonobstructive bowel gas pattern.
